# Patient Record
Sex: Female | Born: 1965 | Race: White | Hispanic: No | Marital: Single | Smoking: Current every day smoker
Health system: Southern US, Community
[De-identification: ages and names within clinical notes are randomized; demographics above are authoritative.]

## PROBLEM LIST (undated history)

## (undated) DIAGNOSIS — F17200 Nicotine dependence, unspecified, uncomplicated: Secondary | ICD-10-CM

## (undated) DIAGNOSIS — R1032 Left lower quadrant pain: Secondary | ICD-10-CM

## (undated) DIAGNOSIS — K5732 Diverticulitis of large intestine without perforation or abscess without bleeding: Secondary | ICD-10-CM

## (undated) HISTORY — DX: Diverticulitis of large intestine without perforation or abscess without bleeding: K57.32

## (undated) HISTORY — DX: Nicotine dependence, unspecified, uncomplicated: F17.200

## (undated) HISTORY — DX: Left lower quadrant pain: R10.32

---

## 2003-09-29 ENCOUNTER — Emergency Department (HOSPITAL_COMMUNITY): Admission: EM | Admit: 2003-09-29 | Discharge: 2003-09-29 | Payer: Self-pay | Admitting: Emergency Medicine

## 2008-06-28 ENCOUNTER — Ambulatory Visit: Payer: Self-pay | Admitting: Family Medicine

## 2008-09-06 ENCOUNTER — Ambulatory Visit: Payer: Self-pay | Admitting: Family Medicine

## 2010-09-04 ENCOUNTER — Encounter: Payer: Self-pay | Admitting: Internal Medicine

## 2010-09-04 ENCOUNTER — Ambulatory Visit (INDEPENDENT_AMBULATORY_CARE_PROVIDER_SITE_OTHER): Payer: 59 | Admitting: Internal Medicine

## 2010-09-04 ENCOUNTER — Other Ambulatory Visit: Payer: 59

## 2010-09-04 ENCOUNTER — Other Ambulatory Visit: Payer: Self-pay | Admitting: Internal Medicine

## 2010-09-04 DIAGNOSIS — F17200 Nicotine dependence, unspecified, uncomplicated: Secondary | ICD-10-CM | POA: Insufficient documentation

## 2010-09-04 DIAGNOSIS — Z0000 Encounter for general adult medical examination without abnormal findings: Secondary | ICD-10-CM

## 2010-09-04 LAB — CBC WITH DIFFERENTIAL/PLATELET
Basophils Relative: 0.6 % (ref 0.0–3.0)
Eosinophils Absolute: 0.1 10*3/uL (ref 0.0–0.7)
Eosinophils Relative: 1.6 % (ref 0.0–5.0)
HCT: 42.6 % (ref 36.0–46.0)
Lymphs Abs: 2.3 10*3/uL (ref 0.7–4.0)
MCHC: 34.3 g/dL (ref 30.0–36.0)
MCV: 91.2 fl (ref 78.0–100.0)
Monocytes Absolute: 0.4 10*3/uL (ref 0.1–1.0)
Neutrophils Relative %: 64.5 % (ref 43.0–77.0)
Platelets: 300 10*3/uL (ref 150.0–400.0)
WBC: 8.3 10*3/uL (ref 4.5–10.5)

## 2010-09-04 LAB — LIPID PANEL
Cholesterol: 102 mg/dL (ref 0–200)
LDL Cholesterol: 44 mg/dL (ref 0–99)
Triglycerides: 26 mg/dL (ref 0.0–149.0)

## 2010-09-04 LAB — HEPATIC FUNCTION PANEL
ALT: 15 U/L (ref 0–35)
Bilirubin, Direct: 0.2 mg/dL (ref 0.0–0.3)
Total Bilirubin: 0.7 mg/dL (ref 0.3–1.2)
Total Protein: 7.2 g/dL (ref 6.0–8.3)

## 2010-09-04 LAB — BASIC METABOLIC PANEL
BUN: 9 mg/dL (ref 6–23)
CO2: 30 mEq/L (ref 19–32)
Chloride: 102 mEq/L (ref 96–112)
Creatinine, Ser: 0.7 mg/dL (ref 0.4–1.2)
Potassium: 3.7 mEq/L (ref 3.5–5.1)

## 2010-09-04 LAB — URINALYSIS, ROUTINE W REFLEX MICROSCOPIC
Bilirubin Urine: NEGATIVE
Hgb urine dipstick: NEGATIVE
Leukocytes, UA: NEGATIVE
Nitrite: NEGATIVE
Urobilinogen, UA: 0.2 (ref 0.0–1.0)

## 2010-09-04 LAB — TSH: TSH: 1.18 u[IU]/mL (ref 0.35–5.50)

## 2010-09-10 NOTE — Assessment & Plan Note (Signed)
Summary: new pt cpx/[facility]/#/cd   Vital Signs:  Patient profile:   45 year old female Height:      69 inches (175.26 cm) Weight:      135.4 pounds (61.55 kg) BMI:     20.07 O2 Sat:      94 % on Room air Temp:     98.5 degrees F (36.94 degrees C) oral Pulse rate:   83 / minute BP sitting:   120 / 80  (left arm) Cuff size:   regular  Vitals Entered By: Orlan Leavens RMA (September 04, 2010 2:01 PM)  O2 Flow:  Room air CC: New patient CPX Is Patient Diabetic? No Pain Assessment Patient in pain? no        Primary Care Provider:  Newt Lukes MD  CC:  New patient CPX.  History of Present Illness: new pt to me and our practice, here to est care  patient is here today for annual physical. Patient feels well and has no complaints.   Preventive Screening-Counseling & Management  Alcohol-Tobacco     Alcohol drinks/day: 0     Alcohol Counseling: not indicated; patient does not drink     Smoking Status: current     Smoking Cessation Counseling: yes     Tobacco Counseling: to quit use of tobacco products  Caffeine-Diet-Exercise     Diet Counseling: not indicated; diet is assessed to be healthy     Does Patient Exercise: no     Exercise Counseling: to improve exercise regimen     Depression Counseling: not indicated; screening negative for depression  Safety-Violence-Falls     Seat Belt Counseling: not indicated; patient wears seat belts     Helmet Counseling: not applicable     Firearm Counseling: not applicable     Fall Risk Counseling: not indicated; no significant falls noted  Clinical Review Panels:  Immunizations   Last Tetanus Booster:  Historical (07/26/2004)   Current Medications (verified): 1)  None  Allergies (verified): 1)  ! 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 I have reviewed all other systems and they were negative.   Physical Exam  General:  thin, fit and spry - alert, well-developed, well-nourished, and cooperative to examination.    Head:  Normocephalic and atraumatic without obvious abnormalities. No apparent alopecia or balding. Eyes:  vision grossly intact; pupils equal, round and reactive to light.  conjunctiva and lids normal.   wears glasses Ears:  normal pinnae bilaterally, without erythema, swelling, or tenderness to palpation. TMs clear, without effusion, or cerumen impaction. Hearing grossly normal bilaterally  Mouth:  teeth and gums in good repair; mucous membranes moist, without lesions or ulcers. oropharynx clear without exudate, no erythema.  Neck:  supple, full ROM, no masses, no thyromegaly; no thyroid nodules or tenderness. no JVD or carotid bruits.  Lungs:  normal respiratory effort, no intercostal retractions or use of accessory muscles; normal breath sounds bilaterally - no crackles and no wheezes.    Heart:  normal rate, regular rhythm, no murmur, and no rub. BLE without edema. Abdomen:  soft, non-tender, normal bowel sounds, no distention; no masses and no appreciable hepatomegaly or splenomegaly.   Genitalia:  defer to gyn Msk:  No deformity or scoliosis noted of thoracic or lumbar spine.   Neurologic:  alert & oriented X3 and cranial nerves II-XII symetrically intact.  strength normal in all extremities, sensation intact to light  touch, and gait normal. speech fluent without dysarthria or aphasia; follows commands with good comprehension.  Skin:  no rashes, vesicles, ulcers, or erythema. No nodules or irregularity to palpation.  Psych:  Oriented X3, memory intact for recent and remote, normally interactive, good eye contact, not anxious appearing, not depressed appearing, and not agitated.      Impression & Recommendations:  Problem # 1:  PREVENTIVE HEALTH CARE (ICD-V70.0) Patient has been counseled on age-appropriate routine health concerns for screening and prevention. These are reviewed and up-to-date. Immunizations are up-to-date or declined. Labs ordered and ECG reviewed - poor r wave progression. refer mammo and gyn, esp with fh ovarian ca Orders: EKG w/ Interpretation (93000) TLB-Lipid Panel (80061-LIPID) TLB-BMP (Basic Metabolic Panel-BMET) (80048-METABOL) TLB-CBC Platelet - w/Differential (85025-CBCD) TLB-Hepatic/Liver Function Pnl (80076-HEPATIC) TLB-TSH (Thyroid Stimulating Hormone) (84443-TSH) TLB-Udip w/ Micro (81001-URINE) Gynecologic Referral (Gyn) Misc. Referral (Misc. Ref)  Problem # 2:  SMOKER (ICD-305.1) O2 sat 94% but no clinical abn or pulm symptoms -  5 minutes today spent on patient education regarding the unhealthy effects of continued tobacco abuse and encouragment of cessation including medical options available to help patient to quit smoking.   Patient Instructions: 1)  it was good to see you today. 2)  exam and vitals look good 3)  labs ordered today - your results will be called to you after review in 48-72 hours from the time of test completion and copy mailed to you; if any changes need to be made or there are abnormal results, you will be notified at that time 4)  we'll make referral to gyneclogy for PAP/pelvic examand screening mammography. Our office will contact you regarding these appointments once made.   5)  Tobacco is very bad for your health and your loved ones! You Should  stop smoking! 6)  Please schedule a follow-up appointment annually for medical physical and labs, call sooner if problems.    Orders Added: 1)  EKG w/ Interpretation [93000] 2)  New Patient 40-64 years [99386] 3)  TLB-Lipid Panel [80061-LIPID] 4)  TLB-BMP (Basic Metabolic Panel-BMET) [80048-METABOL] 5)  TLB-CBC Platelet - w/Differential [85025-CBCD] 6)  TLB-Hepatic/Liver Function Pnl [80076-HEPATIC] 7)  TLB-TSH (Thyroid Stimulating Hormone) [84443-TSH] 8)  TLB-Udip w/ Micro [81001-URINE] 9)  Gynecologic Referral [Gyn] 10)  Misc. Referral [Misc. Ref]   Immunization History:  Tetanus/Td Immunization History:    Tetanus/Td:  historical (07/26/2004)   Immunization History:  Tetanus/Td Immunization History:    Tetanus/Td:  Historical (07/26/2004)

## 2011-02-22 ENCOUNTER — Other Ambulatory Visit (INDEPENDENT_AMBULATORY_CARE_PROVIDER_SITE_OTHER): Payer: 59

## 2011-02-22 ENCOUNTER — Encounter: Payer: Self-pay | Admitting: Internal Medicine

## 2011-02-22 ENCOUNTER — Ambulatory Visit (INDEPENDENT_AMBULATORY_CARE_PROVIDER_SITE_OTHER): Payer: 59 | Admitting: Internal Medicine

## 2011-02-22 VITALS — BP 102/82 | HR 75 | Temp 98.2°F | Ht 69.0 in | Wt 132.0 lb

## 2011-02-22 DIAGNOSIS — R1032 Left lower quadrant pain: Secondary | ICD-10-CM

## 2011-02-22 DIAGNOSIS — F17200 Nicotine dependence, unspecified, uncomplicated: Secondary | ICD-10-CM

## 2011-02-22 LAB — BASIC METABOLIC PANEL
Calcium: 9 mg/dL (ref 8.4–10.5)
Chloride: 107 mEq/L (ref 96–112)
Creatinine, Ser: 0.7 mg/dL (ref 0.4–1.2)
Sodium: 140 mEq/L (ref 135–145)

## 2011-02-22 LAB — URINALYSIS
Bilirubin Urine: NEGATIVE
Hgb urine dipstick: NEGATIVE
Ketones, ur: NEGATIVE
Urine Glucose: NEGATIVE
Urobilinogen, UA: 0.2 (ref 0.0–1.0)

## 2011-02-22 LAB — HEPATIC FUNCTION PANEL
ALT: 14 U/L (ref 0–35)
Alkaline Phosphatase: 59 U/L (ref 39–117)
Bilirubin, Direct: 0.1 mg/dL (ref 0.0–0.3)
Total Protein: 7.3 g/dL (ref 6.0–8.3)

## 2011-02-22 LAB — CBC WITH DIFFERENTIAL/PLATELET
Basophils Relative: 0.5 % (ref 0.0–3.0)
Eosinophils Relative: 1.2 % (ref 0.0–5.0)
Hemoglobin: 13.9 g/dL (ref 12.0–15.0)
Lymphocytes Relative: 22.6 % (ref 12.0–46.0)
MCV: 92 fl (ref 78.0–100.0)
Neutro Abs: 4.4 10*3/uL (ref 1.4–7.7)
Neutrophils Relative %: 69.8 % (ref 43.0–77.0)
RBC: 4.55 Mil/uL (ref 3.87–5.11)
WBC: 6.4 10*3/uL (ref 4.5–10.5)

## 2011-02-22 NOTE — Patient Instructions (Addendum)
It was good to see you today. Test(s) ordered today. Your results will be called to you after review (48-72hours after test completion). If any changes need to be made, you will be notified at that time. we'll make referral for CT scan of abdomen and pelvis with contrast. Our office will contact you regarding appointment(s) once made. You will then be contacted with results as available (24-72 hours after test complete) follow up with your gynecologist as discussed for pelvic/PAP - call if referral needed for this Call in next 2 weeks if symptoms unimpooved for other evaluation or treatment as needed Don't forget to think more about giving up those cigarettes!

## 2011-02-22 NOTE — Progress Notes (Signed)
  Subjective:     Heidi Gordon is a 45 y.o. female who presents for evaluation of abdominal pain. Onset was 10 days ago. Symptoms have been unchanged. The pain is described as burning, stabbing and twisting, and is 5/10 in intensity. Pain is located in the LLQ without radiation.  Aggravating factors: movement, pressure and morning.  Alleviating factors: bowel movements. Associated symptoms: none. The patient denies anorexia, chills, constipation, diarrhea, dysuria, fever, hematochezia, hematuria, melena, nausea and vomiting.  The patient's history has been marked as reviewed and updated as appropriate.  Review of Systems Pertinent items are noted in HPI.   No travel, no weight changes. No chest pain or shortness of breath; no back pain   Objective:    BP 102/82  Pulse 75  Temp(Src) 98.2 F (36.8 C) (Oral)  Ht 5\' 9"  (1.753 m)  Wt 132 lb (59.875 kg)  BMI 19.49 kg/m2  SpO2 99% General appearance: alert, cooperative and no distress Lungs: clear to auscultation bilaterally Heart: regular rate and rhythm, S1, S2 normal, no murmur, click, rub or gallop Abdomen: soft, non-tender; bowel sounds normal; no masses,  no organomegaly    Assessment:    Abdominal pain, likely secondary to colonic spasm; concern due to FH anal cancer - dad dx age 68yo .   Tobacco abuse, ongoing Plan:    The diagnosis was discussed with the patient and evaluation and treatment plans outlined. See orders for lab and imaging studies. Reassured patient that symptoms are almost certainly benign and self-resolving. Referral to Gynecology. Call back with update in 2 weeks. sooner if problems -  considered treatment with antispasmodic, pt declines need for same at this time  5 minutes today spent counseling patient on unhealthy effects of continued tobacco abuse and encouragement of cessation including medical options available to help the patient quit smoking.

## 2011-02-24 ENCOUNTER — Ambulatory Visit (INDEPENDENT_AMBULATORY_CARE_PROVIDER_SITE_OTHER)
Admission: RE | Admit: 2011-02-24 | Discharge: 2011-02-24 | Disposition: A | Discharge status: Home / Self Care | Payer: 59 | Source: Ambulatory Visit | Attending: Internal Medicine | Admitting: Internal Medicine

## 2011-02-24 DIAGNOSIS — R1032 Left lower quadrant pain: Secondary | ICD-10-CM

## 2011-02-24 MED ORDER — IOHEXOL 300 MG/ML  SOLN
100.0000 mL | Freq: Once | INTRAMUSCULAR | Status: AC | PRN
  Administered 2011-02-24: 100 mL via INTRAVENOUS

## 2011-05-03 ENCOUNTER — Telehealth: Payer: Self-pay | Admitting: Internal Medicine

## 2011-05-03 ENCOUNTER — Other Ambulatory Visit (INDEPENDENT_AMBULATORY_CARE_PROVIDER_SITE_OTHER): Payer: 59

## 2011-05-03 ENCOUNTER — Ambulatory Visit (INDEPENDENT_AMBULATORY_CARE_PROVIDER_SITE_OTHER): Payer: 59 | Admitting: Internal Medicine

## 2011-05-03 ENCOUNTER — Encounter: Payer: Self-pay | Admitting: Internal Medicine

## 2011-05-03 VITALS — BP 122/64 | HR 89 | Temp 97.8°F | Ht 69.0 in | Wt 124.0 lb

## 2011-05-03 DIAGNOSIS — R1032 Left lower quadrant pain: Secondary | ICD-10-CM

## 2011-05-03 LAB — URINALYSIS
Nitrite: NEGATIVE
Total Protein, Urine: NEGATIVE
Urine Glucose: NEGATIVE
pH: 6 (ref 5.0–8.0)

## 2011-05-03 LAB — LIPASE: Lipase: 39 U/L (ref 11.0–59.0)

## 2011-05-03 LAB — CBC WITH DIFFERENTIAL/PLATELET
Basophils Relative: 0.2 % (ref 0.0–3.0)
Eosinophils Absolute: 0.1 10*3/uL (ref 0.0–0.7)
Eosinophils Relative: 0.4 % (ref 0.0–5.0)
Lymphocytes Relative: 11.8 % — ABNORMAL LOW (ref 12.0–46.0)
MCHC: 33.9 g/dL (ref 30.0–36.0)
Neutrophils Relative %: 80.3 % — ABNORMAL HIGH (ref 43.0–77.0)
RBC: 4.55 Mil/uL (ref 3.87–5.11)
WBC: 14 10*3/uL — ABNORMAL HIGH (ref 4.5–10.5)

## 2011-05-03 LAB — BASIC METABOLIC PANEL
BUN: 11 mg/dL (ref 6–23)
Chloride: 103 mEq/L (ref 96–112)
Potassium: 3.8 mEq/L (ref 3.5–5.1)

## 2011-05-03 LAB — HEPATIC FUNCTION PANEL
ALT: 19 U/L (ref 0–35)
AST: 15 U/L (ref 0–37)
Bilirubin, Direct: 0.1 mg/dL (ref 0.0–0.3)
Total Bilirubin: 0.8 mg/dL (ref 0.3–1.2)

## 2011-05-03 LAB — SEDIMENTATION RATE: Sed Rate: 13 mm/hr (ref 0–22)

## 2011-05-03 MED ORDER — IBUPROFEN 600 MG PO TABS: ORAL_TABLET | ORAL | Status: DC

## 2011-05-03 MED ORDER — HYDROCODONE-ACETAMINOPHEN 7.5-325 MG PO TABS: 1.0000 | ORAL_TABLET | Freq: Four times a day (QID) | ORAL | Status: DC | PRN

## 2011-05-03 NOTE — Assessment & Plan Note (Signed)
10/12 - seems to be related to a ruptured ovarian cyst - see below: IMPRESSION - abd CT 8/12: Dominant 2.5 cm follicle in the left ovary.  Otherwise, no CT findings to account for the patient's abdominal  complaints.  Original Report Authenticated By: Charline Bills, M.D.  We will get labs. Toradol IM Start Hydrocodone, Ibuprofen. To ER if sick

## 2011-05-03 NOTE — Telephone Encounter (Signed)
Heidi Gordon, please, inform patient that all labs are normal except for elev WBC. Rx as we discussed. Call if not better or if worse Thx

## 2011-05-03 NOTE — Patient Instructions (Signed)
Go to ER if worse 

## 2011-05-03 NOTE — Progress Notes (Signed)
  Subjective:    Patient ID: Heidi Gordon, female    DOB: 16-Jul-1966, 45 y.o.   MRN: [id number]  HPI  C/o severe LLQ abd pain since Sun am irrad to RLQ and L chest. She had some nausea. No LOC, chills, diarrhea, dysuria. She did not take any pain meds. No injury. The abd pain she had in 8/12 has resolved. LMP 2wks ago. Denies pregnancy  Review of Systems  Constitutional: Negative for chills, activity change, appetite change, fatigue and unexpected weight change.  HENT: Negative for congestion, mouth sores and sinus pressure.   Eyes: Negative for visual disturbance.  Respiratory: Negative for cough, chest tightness and shortness of breath.   Cardiovascular: Negative for chest pain.  Gastrointestinal: Positive for nausea and abdominal pain. Negative for vomiting, constipation, blood in stool, abdominal distention, anal bleeding and rectal pain.  Genitourinary: Positive for flank pain. Negative for dysuria, urgency, frequency, decreased urine volume, difficulty urinating and vaginal pain.  Musculoskeletal: Negative for back pain and gait problem.  Skin: Negative for pallor and rash.  Neurological: Negative for dizziness, tremors, weakness, numbness and headaches.  Psychiatric/Behavioral: Negative for confusion and sleep disturbance.   LMP    Objective:   Physical Exam  Constitutional: She appears well-developed and well-nourished. No distress.  HENT:  Head: Normocephalic.  Right Ear: External ear normal.  Left Ear: External ear normal.  Nose: Nose normal.  Mouth/Throat: Oropharynx is clear and moist.  Eyes: Conjunctivae are normal. Pupils are equal, round, and reactive to light. Right eye exhibits no discharge. Left eye exhibits no discharge.  Neck: Normal range of motion. Neck supple. No JVD present. No tracheal deviation present. No thyromegaly present.  Cardiovascular: Normal rate, regular rhythm and normal heart sounds.   Pulmonary/Chest: No stridor. No respiratory distress. She  has no wheezes.  Abdominal: Soft. Bowel sounds are normal. She exhibits no distension and no mass. There is tenderness. There is guarding. There is no rebound.  Musculoskeletal: She exhibits no edema and no tenderness.  Lymphadenopathy:    She has no cervical adenopathy.  Neurological: She displays normal reflexes. No cranial nerve deficit. She exhibits normal muscle tone. Coordination normal.  Skin: No rash noted. No erythema.  Psychiatric: She has a normal mood and affect. Her behavior is normal. Judgment and thought content normal.          Assessment & Plan:

## 2011-05-04 MED ORDER — TRAMADOL HCL 50 MG PO TABS: 50.0000 mg | ORAL_TABLET | Freq: Two times a day (BID) | ORAL | Status: DC | PRN

## 2011-05-04 MED ORDER — PROMETHAZINE HCL 12.5 MG PO TABS: 12.5000 mg | ORAL_TABLET | Freq: Four times a day (QID) | ORAL | Status: DC | PRN

## 2011-05-04 MED ORDER — CIPROFLOXACIN HCL 500 MG PO TABS: 500.0000 mg | ORAL_TABLET | Freq: Two times a day (BID) | ORAL | Status: DC

## 2011-05-04 NOTE — Telephone Encounter (Signed)
Cipro called in; start Cipro. To ER if worse D/c Vicodin. Tramadol called in. Promethazine called in OV tomorrow w/Dr Felicity Coyer or me Thx

## 2011-05-04 NOTE — Telephone Encounter (Signed)
Patient informed, Scheduled with Dr Felicity Coyer tomorrow AM.

## 2011-05-04 NOTE — Telephone Encounter (Signed)
Pt called stating she has having chills and pain and believes she is running a fever. Pt also states that Hydrocodone is causing stomach upset to be worse. Pt is requesting ABX, please advise.

## 2011-05-04 NOTE — Telephone Encounter (Signed)
Pt informed. What abx does she need? She never got it.

## 2011-05-05 ENCOUNTER — Encounter: Payer: Self-pay | Admitting: Internal Medicine

## 2011-05-05 ENCOUNTER — Ambulatory Visit (INDEPENDENT_AMBULATORY_CARE_PROVIDER_SITE_OTHER): Payer: 59 | Admitting: Internal Medicine

## 2011-05-05 ENCOUNTER — Ambulatory Visit (INDEPENDENT_AMBULATORY_CARE_PROVIDER_SITE_OTHER)
Admission: RE | Admit: 2011-05-05 | Discharge: 2011-05-05 | Disposition: A | Discharge status: Home / Self Care | Payer: 59 | Source: Ambulatory Visit | Attending: Internal Medicine | Admitting: Internal Medicine

## 2011-05-05 ENCOUNTER — Other Ambulatory Visit: Payer: Self-pay | Admitting: Internal Medicine

## 2011-05-05 DIAGNOSIS — D72829 Elevated white blood cell count, unspecified: Secondary | ICD-10-CM

## 2011-05-05 DIAGNOSIS — K5732 Diverticulitis of large intestine without perforation or abscess without bleeding: Secondary | ICD-10-CM

## 2011-05-05 DIAGNOSIS — N83202 Unspecified ovarian cyst, left side: Secondary | ICD-10-CM

## 2011-05-05 DIAGNOSIS — R1032 Left lower quadrant pain: Secondary | ICD-10-CM

## 2011-05-05 DIAGNOSIS — N83209 Unspecified ovarian cyst, unspecified side: Secondary | ICD-10-CM

## 2011-05-05 MED ORDER — KETOROLAC TROMETHAMINE 30 MG/ML IJ SOLN
30.0000 mg | Freq: Once | INTRAMUSCULAR | Status: AC
  Administered 2011-05-05: 30 mg via INTRAMUSCULAR

## 2011-05-05 MED ORDER — METRONIDAZOLE 500 MG PO TABS: 500.0000 mg | ORAL_TABLET | Freq: Three times a day (TID) | ORAL | Status: AC

## 2011-05-05 MED ORDER — IOHEXOL 300 MG/ML  SOLN
80.0000 mL | Freq: Once | INTRAMUSCULAR | Status: AC | PRN
  Administered 2011-05-05: 80 mL via INTRAVENOUS

## 2011-05-05 NOTE — Patient Instructions (Signed)
It was good to see you today. Tordol shot for pain given to you today Add flagyl antibiotics to Cipro antibiotics and other current medications for symptomatic relief as discussed Repeat CT scan to follow up ovary cyst and exclude other GI/colon problems as discussed - refer to GI or gyn to depend on these findings and your response to treatment - will call you after results reviewed with "next step" if your symptoms continue to worsen (pain, fever, etc), or if you are unable take anything by mouth (pills, fluids, etc), you should go to the emergency room or call again here for further evaluation and treatment.

## 2011-05-05 NOTE — Progress Notes (Signed)
  Subjective:    Patient ID: Heidi Gordon, female    DOB: 11/16/1965, 45 y.o.   MRN: [id number]  HPI Here follow up LLQ pain Onset 4 days ago Seen 48h ago for same by partner AVP>> labs with inc WBC, neg UA Pain not improved with hydrocodone (nausea worse)>>started Cipro, NSAIDs, tramadol and prometh last PM Also hx same but less severe pain episode late 01/2011 LLQ pain>> CT a/p w/CM showed dominate  L ov follicle, no other abnormality to explain pain Pain associated with nausea, no V or bowel changes Started LLQ, now involving BLQ +fever +FH anal ca (dad age 71) and aunt intestinal ca (age 78s) but no inflammatory bowel dz hx  Past Medical History  Diagnosis Date  . SMOKER     Review of Systems  Constitutional: Positive for fever (101 at home last 48h), chills and appetite change. Negative for unexpected weight change.  Respiratory: Negative for cough and shortness of breath.   Cardiovascular: Negative for chest pain.  Gastrointestinal: Positive for nausea and abdominal pain. Negative for vomiting, diarrhea, constipation and rectal pain.  Genitourinary: Positive for pelvic pain. Negative for dysuria, hematuria, flank pain, vaginal bleeding, vaginal discharge, vaginal pain and menstrual problem.       Objective:   Physical Exam BP 122/72  Pulse 106  Temp(Src) 98.9 F (37.2 C) (Oral)  Ht 5\' 9"  (1.753 m)  Wt 132 lb 3.2 oz (59.966 kg)  BMI 19.52 kg/m2  SpO2 98% Wt Readings from Last 3 Encounters:  05/05/11 132 lb 3.2 oz (59.966 kg)  05/03/11 124 lb 0.6 oz (56.264 kg)  02/22/11 132 lb (59.875 kg)   Constitutional: She is thin but appears well-developed and well-nourished. No distress but uncomfortable due to pain.  Neck: Normal range of motion. Neck supple. No JVD present. No thyromegaly present.  Cardiovascular: Normal rate, regular rhythm and normal heart sounds.  No murmur heard. No BLE edema. Pulmonary/Chest: Effort normal and breath sounds normal. No respiratory  distress. She has no wheezes.  Abdominal: Soft. Bowel sounds are hyperactive. She exhibits no distension. There is tenderness along L side, no R/G. no masses Pelvic: defer Skin: Skin is warm and dry. No rash noted. No erythema.  Psychiatric: She has a normal mood and affect. Her behavior is normal. Judgment and thought content normal.   LMP 20 days ago, normal  Lab Results  Component Value Date   WBC 14.0* 05/03/2011   HGB 14.3 05/03/2011   HCT 42.1 05/03/2011   PLT 295.0 05/03/2011   GLUCOSE 87 05/03/2011   CHOL 102 09/04/2010   TRIG 26.0 09/04/2010   HDL 52.70 09/04/2010   LDLCALC 44 09/04/2010   ALT 19 05/03/2011   AST 15 05/03/2011   NA 139 05/03/2011   K 3.8 05/03/2011   CL 103 05/03/2011   CREATININE 0.8 05/03/2011   BUN 11 05/03/2011   CO2 26 05/03/2011   TSH 1.18 09/04/2010        Assessment & Plan:  LLQ pain with leukocytosis - reports fever at home to 101 - suspect diverticular dz vs ovarian problem (prior CT with last episode showed only L ov cyst 2.5cm) - re image now with follow up CT to reeval ?GI abnormalities vs change in cyst to help determine if GI or gyn eval needed (never follow up with gyn since 02/24/11 CT) - continue Cipro (started <12h ago), add Flagyl and cont symptomatic tx with prn phenergan and ibuprofen - shot Tordol 30mg  IM today

## 2011-05-21 ENCOUNTER — Encounter: Payer: Self-pay | Admitting: Internal Medicine

## 2011-05-27 ENCOUNTER — Ambulatory Visit: Payer: 59 | Admitting: Internal Medicine

## 2011-06-24 ENCOUNTER — Encounter: Payer: Self-pay | Admitting: Internal Medicine

## 2011-06-24 ENCOUNTER — Ambulatory Visit (INDEPENDENT_AMBULATORY_CARE_PROVIDER_SITE_OTHER): Payer: 59 | Admitting: Internal Medicine

## 2011-06-24 DIAGNOSIS — K5732 Diverticulitis of large intestine without perforation or abscess without bleeding: Secondary | ICD-10-CM

## 2011-06-24 DIAGNOSIS — Z720 Tobacco use: Secondary | ICD-10-CM

## 2011-06-24 DIAGNOSIS — R109 Unspecified abdominal pain: Secondary | ICD-10-CM

## 2011-06-24 DIAGNOSIS — F17200 Nicotine dependence, unspecified, uncomplicated: Secondary | ICD-10-CM

## 2011-06-24 MED ORDER — PEG-KCL-NACL-NASULF-NA ASC-C 100 G PO SOLR: 1.0000 | Freq: Once | ORAL | Status: DC

## 2011-06-24 NOTE — Patient Instructions (Signed)
You have been scheduled for a colonoscopy. Please follow written instructions given to you at your visit today.  Please pick up your prep kit at the pharmacy within the next 2-3 days.  We have sent the following medications to your pharmacy for you to pick up at your convenience: moviprep, please follow the instructions given to you today at your visit.

## 2011-06-24 NOTE — Progress Notes (Signed)
Addended by: Adonis Housekeeper A on: 06/24/2011 10:40 AM   Modules accepted: Orders

## 2011-06-24 NOTE — Progress Notes (Signed)
Subjective:    Patient ID: Heidi Gordon, female    DOB: 08-23-1965, 45 y.o.   MRN: [id number]  HPI Heidi Gordon is a 45 yo female with PMH of tobacco use and diverticulosis with an episode of diverticulitis seen in consultation at the request of Dr. Felicity Coyer for evaluation of left lower quadrant pain. The patient reports her lower abdominal pain dates back to the summer of 2012 (around June or August).  Initially the patient was unsure if this pain was resulting from an ovarian cyst or another source such as her colon. The pain seemed improved over time, and then she developed more severe pain in October 2012. This pain was described as a sharp left lower quadrant pain which was severe in nature. She reports this was associated with nausea and vomiting, fever and poor appetite. CT scan at that point showed inflammation and possible microperforation and she was treated with approximately 3 weeks of ciprofloxacin and metronidazole. She reports this significantly helped her pain, and today she is doing "okay". She reports she's had "no major flares since October". She does describe a "dense feeling" or heaviness in her left lower corner. She is having bowel movements once daily to every other day, but they remain loose. They are nonbloody and she denies melena. Prior to the last several months her stools were once daily and formed. She is no longer having nausea or vomiting. Her appetite is okay and her weight has been stable to slightly increased. She's not had further fevers. She is still menstruating and these cycles have been normal for her.    Review of Systems Constitutional: Negative for fever, chills, night sweats, activity change, appetite change and unexpected weight change HEENT: Negative for sore throat, mouth sores and trouble swallowing. Eyes: Negative for visual disturbance Respiratory: Negative for cough, chest tightness and shortness of breath Cardiovascular: Negative for chest pain,  palpitations and lower extremity swelling Gastrointestinal: See history of present illness Genitourinary: Negative for dysuria and hematuria. Musculoskeletal: Negative for back pain, arthralgias and myalgias Skin: Negative for rash or color change Neurological: Negative for headaches, weakness, numbness Hematological: Negative for adenopathy, negative for easy bruising/bleeding Psychiatric/behavioral: Negative for depressed mood, negative for anxiety   Patient Active Problem List  Diagnoses  . SMOKER  . LLQ abdominal pain   Past Surgical History  Procedure Date  . Cesarean section 2006   Meds: NONE  Allergies  Allergen Reactions  . Penicillins    Family History  Problem Relation Age of Onset  . Cancer Sister     Ovarian w/ hysterectomy age 58  . Cancer Father     Anal  . Alcohol abuse Other   . Hypertension Other   . Diabetes Father   . Colon polyps Father   . Colon cancer Father     Social History  . Marital Status: Single   Social History Main Topics  . Smoking status: Current Everyday Smoker  . Smokeless tobacco: None  . Alcohol Use: No  . 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 No distress. HEENT: Normocephalic and atraumatic. Oropharynx is clear and moist. No oropharyngeal exudate. Conjunctivae are normal. Pupils are  equal round and reactive to light. No scleral icterus. Neck: Neck supple. Trachea midline. Cardiovascular: Normal rate, regular rhythm and intact distal pulses. No M/R/G Pulmonary/chest: Effort normal and breath sounds normal. No wheezing, rales or rhonchi. Abdominal: Soft, tenderness to palpation left middle and lower quadrant without rebound or guarding,  nondistended. Bowel sounds active throughout. There are no masses palpable. No hepatosplenomegaly. Extremities: no clubbing, cyanosis, or edema Lymphadenopathy: No cervical adenopathy noted. Neurological: Alert and oriented to person place and time. Skin: Skin is warm and dry. No rashes noted. Tattoos noted upper extremities Psychiatric: Normal mood and affect. Behavior is normal.  CT abdomen and pelvis 05/05/2011  Comparison: 02/24/2011   Findings: 6 mm tiny low density lesion in the left liver is unchanged.  This is probably a cyst.  Otherwise liver and spleen are normal.  Stomach, duodenum, pancreas, gallbladder, and adrenal glands are normal.  Stable tiny cyst in the upper pole of the left kidney.  The right kidney is unremarkable.   No abdominal aortic aneurysm.  No free fluid or lymphadenopathy in the abdomen.   Just lateral to the left kidney, there is a short segment of abnormal appearing proximal to mid descending colon.  The colon has edema/inflammation in a small fluid collection just posterior to it which is probably extraperitoneal.  There are several tiny associated extraluminal gas locules and some subtle rim enhancement of the fluid.   Imaging through the pelvis shows diverticular changes in the sigmoid colon without sigmoid diverticulitis.  Uterus is unremarkable.  The bladder is decompressed.  There is no adnexal mass.  No pelvic sidewall lymphadenopathy.  The terminal ileum and the appendix are normal.   Bone windows reveal no worrisome lytic or sclerotic osseous lesions.  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   IMPRESSION: Abnormal proximal to mid descending colon. Imaging features are most suggestive of diverticulitis with a microperforation and a tiny amount of adjacent edema/fluid. There is no drainable fluid collection or abscess at this time.  Colonic neoplasm could present similarly, but this area of the  colon was normal on the CT scan from 2 months ago. ______________________________________________________________________ CT abdomen and pelvis 02/22/2011:   Findings: Lung bases are clear.   6 mm hypoenhancing lesion in the medial segment left hepatic lobe (series 2/image 20), too small to characterize, likely benign.   Spleen, pancreas, and adrenal glands are within normal limits.   Gallbladder is unremarkable.  No intrahepatic or extrahepatic ductal dilatation.   Kidneys are unremarkable, noting a 9 mm left upper pole cyst.  No hydronephrosis.   No evidence of bowel obstruction.  Normal appendix.   No abdominopelvic ascites.   No suspicious abdominopelvic lymphadenopathy.   No evidence of abdominal aortic aneurysm.   Uterus and right ovary are unremarkable.  Left ovary is notable for a dominant 2.5 cm follicle.   Bladder is within normal limits.   Mild degenerative changes of the visualized thoracolumbar spine. Bilateral pars defects at L5 with grade 1 anterolisthesis of L5 on S1.   IMPRESSION: Dominant 2.5 cm follicle in the left ovary.   Otherwise, no CT findings to account for the patient's abdominal complaints.     Assessment & Plan:  45 yo female with PMH of tobacco use and diverticulosis with an episode of diverticulitis seen in consultation at the request of Dr. Felicity Coyer for evaluation of left lower quadrant pain  1. Diverticulitis -- the patient had CT evidence of diverticulitis  in the descending colon along with likely microperforation and small abscess.  Clinically she is significant a better, though she does still have some heaviness in the left lower quadrant. It is unclear to me if this represents incompletely resolved infection or possible mild stricture from her resolved inflammation. While diverticulitis remains highest in the differential, I cannot completely exclude another process such as colonic Crohn's or underlying malignancy. Given this and her  persistent symptoms I recommended proceeding with colonoscopy and she is agreeable. I like to check a CBC today to ensure that this is normalized. It remains elevated then I will be inclined to treat her empirically for incompletely resolved diverticulitis. We discussed colonoscopy including the risk and benefits. Followup will be after colonoscopy. I offered pain medication and the patient does not feel this is necessary at this time. I have asked that she call me back should this change.

## 2011-07-15 ENCOUNTER — Encounter: Payer: 59 | Admitting: Internal Medicine

## 2011-08-05 ENCOUNTER — Encounter: Payer: 59 | Admitting: Internal Medicine

## 2013-01-19 IMAGING — CT CT ABD-PELV W/ CM
2 of 5 series · 16 of 46 positions shown, 18 images · IV contrast (Omnipaque 300)
Comparison: 02/24/2011

CLINICAL DATA: Left lower quadrant and left pelvic pain.

CT ABDOMEN AND PELVIS WITH CONTRAST
TECHNIQUE: Multidetector CT imaging of the abdomen and pelvis was
performed following the standard protocol during bolus
administration of intravenous contrast.
Contrast: 80mL OMNIPAQUE IOHEXOL 300 MG/ML IV SOLN

[Series 2: abd/ pel 5mm · axial · 0.63mm/px · z∈[-426,-46]mm · 13 of 86 slices shown, 15 images]
[im 5/86  soft-tissue]
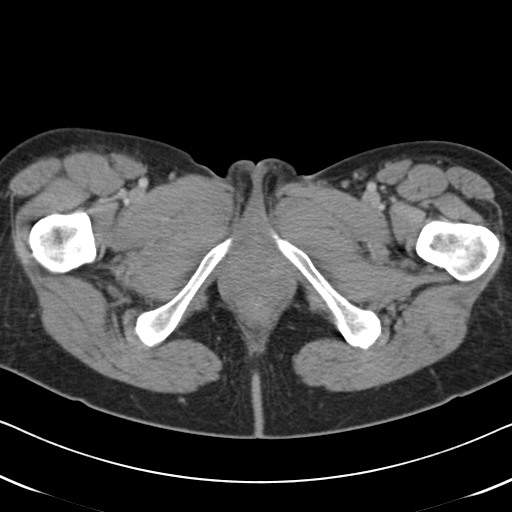
[im 5/86  bone]
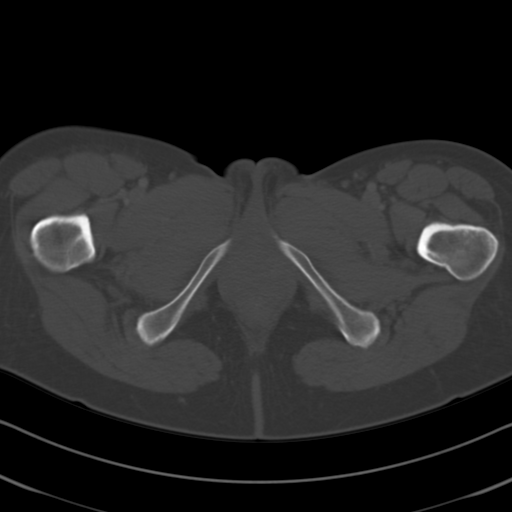
[im 14/86  soft-tissue]
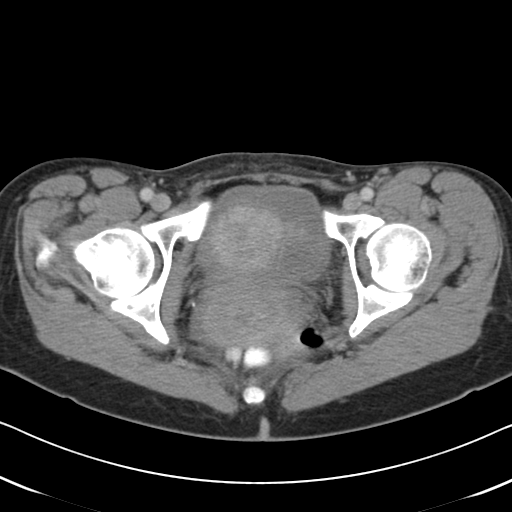
[im 18/86  soft-tissue]
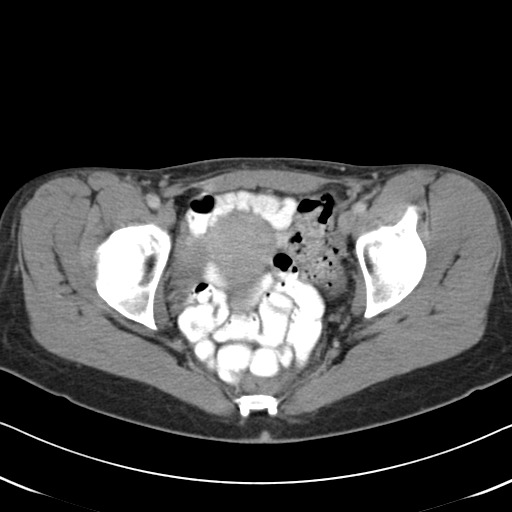
[im 23/86  soft-tissue]
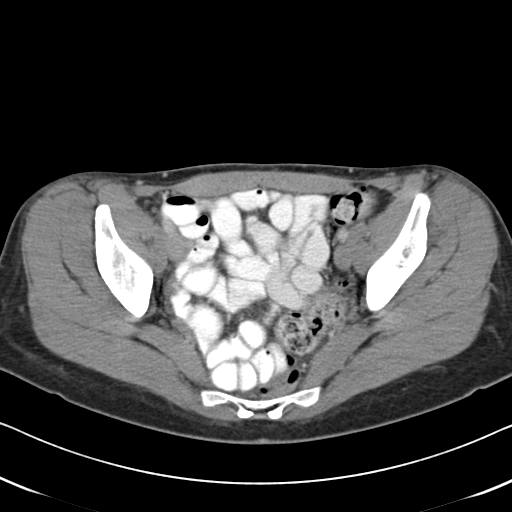
[im 32/86  soft-tissue]
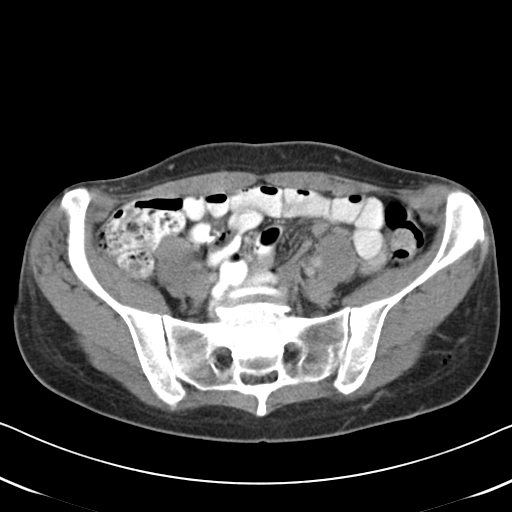
[im 36/86  soft-tissue]
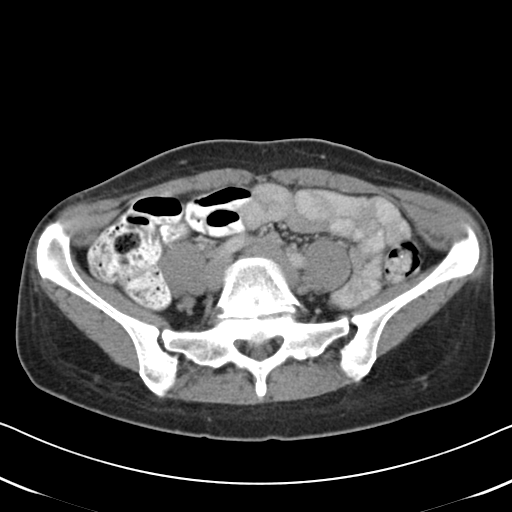
[im 45/86  soft-tissue]
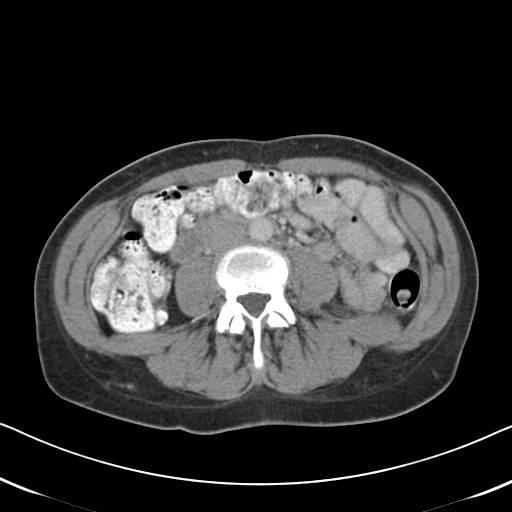
[im 50/86  soft-tissue]
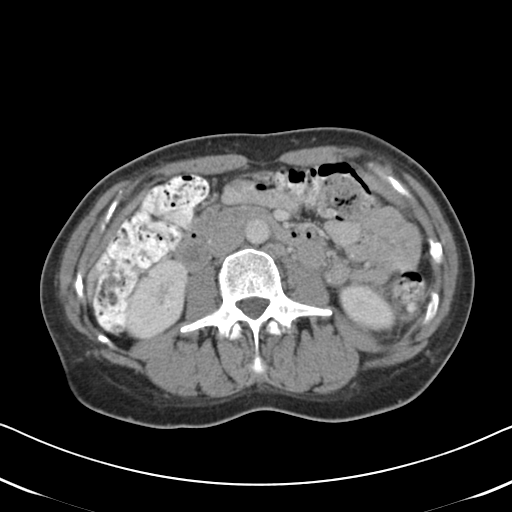
[im 54/86  soft-tissue]
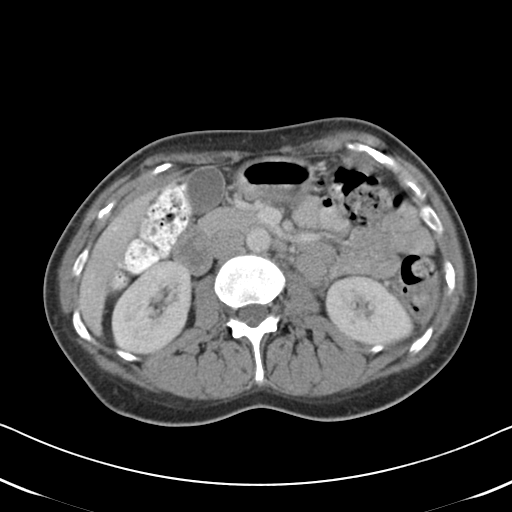
[im 54/86  bone]
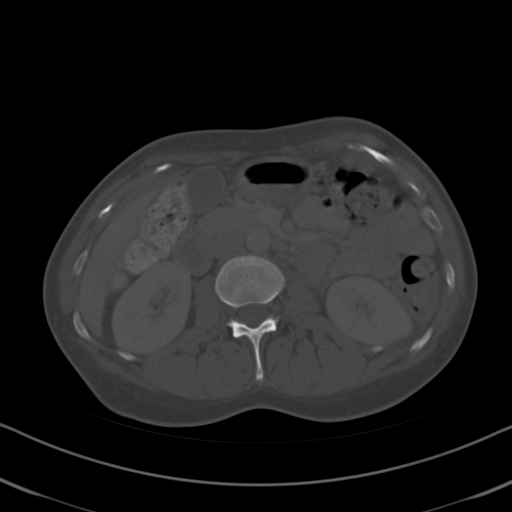
[im 63/86  soft-tissue]
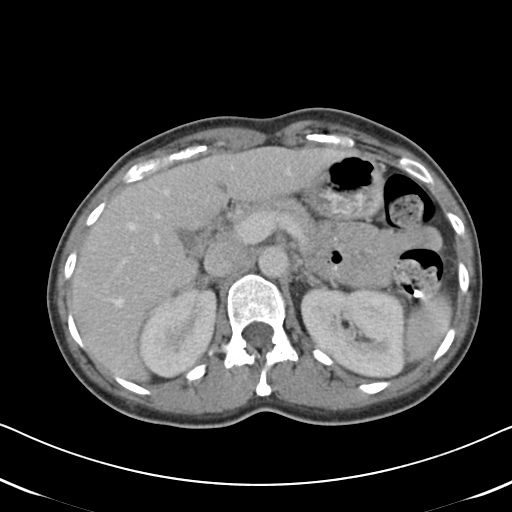
[im 68/86  soft-tissue]
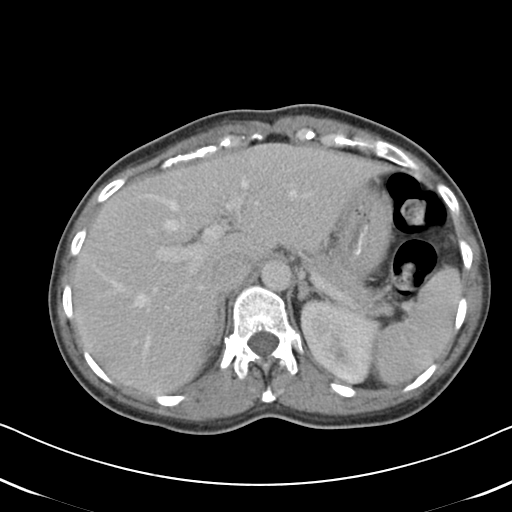
[im 72/86  soft-tissue]
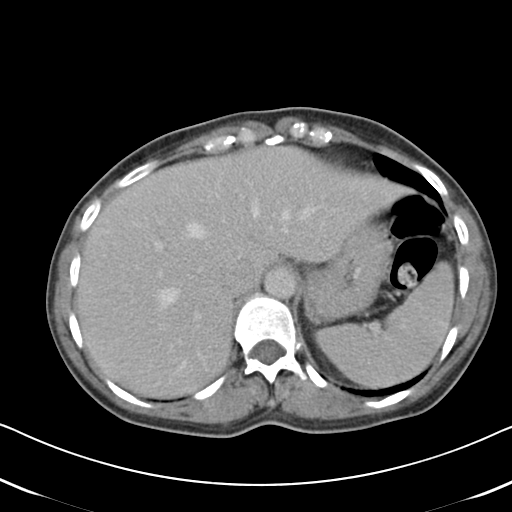
[im 81/86  soft-tissue]
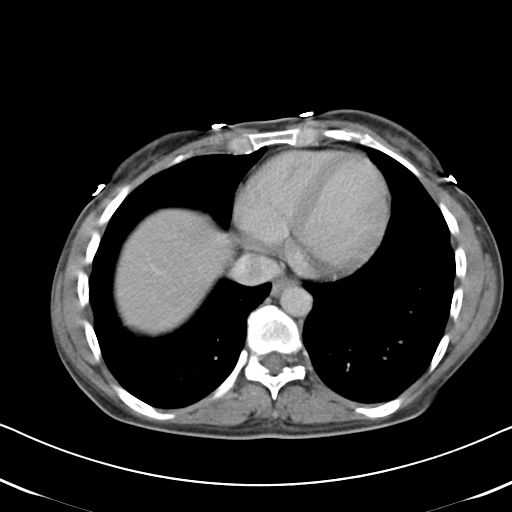

[Series 602: cor · coronal · 0.87mm/px · 3 of 87 slices shown]
[im 29/87  soft-tissue]
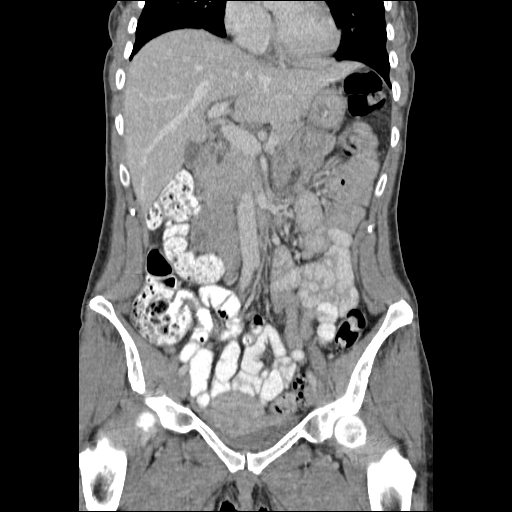
[im 39/87  soft-tissue]
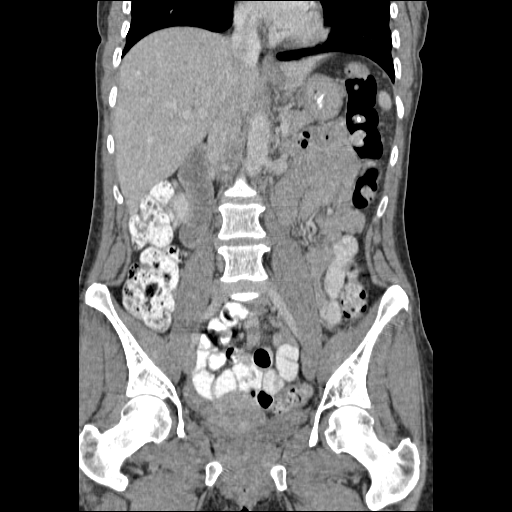
[im 48/87  soft-tissue]
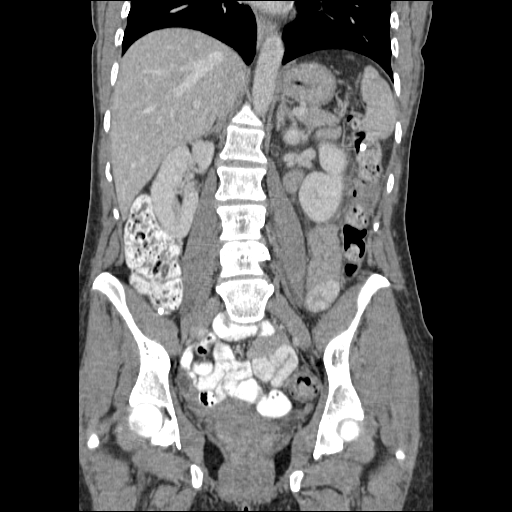

[16 of 46 positions shown; findings below may reference images not displayed]

FINDINGS: 6 mm tiny low density lesion in the left liver is
unchanged.  This is probably a cyst.  Otherwise liver and spleen
are normal.  Stomach, duodenum, pancreas, gallbladder, and adrenal
glands are normal.  Stable tiny cyst in the upper pole of the left
kidney.  The right kidney is unremarkable.

No abdominal aortic aneurysm.  No free fluid or lymphadenopathy in
the abdomen.

Just lateral to the left kidney, there is a short segment of
abnormal appearing proximal to mid descending colon.  The colon has
edema/inflammation in a small fluid collection just posterior to it
which is probably extraperitoneal.  There are several tiny
associated extraluminal gas locules and some subtle rim enhancement
of the fluid.

Imaging through the pelvis shows diverticular changes in the
sigmoid colon without sigmoid diverticulitis.  Uterus is
unremarkable.  The bladder is decompressed.  There is no adnexal
mass.  No pelvic sidewall lymphadenopathy.  The terminal ileum and
the appendix are normal.

Bone windows reveal no worrisome lytic or sclerotic osseous
lesions.  Bilateral pars defects are seen at L5 with grade 1-2
anterolisthesis of L5 on S1.
IMPRESSION: Abnormal proximal to mid descending colon. Imaging features are
most suggestive of diverticulitis with a microperforation and a
tiny amount of adjacent edema/fluid. There is no drainable fluid
collection or abscess at this time.  Colonic neoplasm could present
similarly, but this area of the colon was normal on the CT scan
from 2 months ago.

I personally called these results to Dr. Daoiz by telephone at
[phone] hours on 05/05/2011.

## 2013-07-13 ENCOUNTER — Ambulatory Visit (INDEPENDENT_AMBULATORY_CARE_PROVIDER_SITE_OTHER): Payer: BC Managed Care – PPO | Admitting: Internal Medicine

## 2013-07-13 ENCOUNTER — Encounter: Payer: Self-pay | Admitting: Internal Medicine

## 2013-07-13 VITALS — BP 120/80 | HR 71 | Temp 98.7°F | Wt 142.1 lb

## 2013-07-13 DIAGNOSIS — L259 Unspecified contact dermatitis, unspecified cause: Secondary | ICD-10-CM

## 2013-07-13 DIAGNOSIS — R1900 Intra-abdominal and pelvic swelling, mass and lump, unspecified site: Secondary | ICD-10-CM

## 2013-07-13 DIAGNOSIS — Z1239 Encounter for other screening for malignant neoplasm of breast: Secondary | ICD-10-CM

## 2013-07-13 DIAGNOSIS — L309 Dermatitis, unspecified: Secondary | ICD-10-CM

## 2013-07-13 MED ORDER — TRIAMCINOLONE ACETONIDE 0.1 % EX CREA: 1.0000 "application " | TOPICAL_CREAM | Freq: Two times a day (BID) | CUTANEOUS | Status: DC

## 2013-07-13 NOTE — Patient Instructions (Signed)
It was good to see you today.  We have reviewed your prior records including labs and tests today  Medications reviewed and updated Apply steroid cream to rash twice daily as needed -Your prescription(s) have been submitted to your pharmacy. Please take as directed and contact our office if you believe you are having problem(s) with the medication(s).  Will refer to radiology for screening mammogram and pelvic ultrasound -  Suspect the "mass"is an enlarged lymph node. Ultrasound will further define the anatomy and if problems, will pursue additional treatment as needed  Please send copy of your annual wellness lab results including lipids, sugar and thyroid  Follow up every 1-2 years here for medical annual wellness review, please call sooner if problems

## 2013-07-13 NOTE — Progress Notes (Signed)
   Subjective:    Patient ID: Heidi Gordon, female    DOB: 1965/12/29, 47 y.o.   MRN: [id number]  Rash Pertinent negatives include no cough, diarrhea, fatigue, fever, shortness of breath or vomiting.   Also concerned R pelvic area "marble" present x 1 week - ?enlarging size - FH cancer so concerned with same  Past Medical History  Diagnosis Date  . SMOKER   . LLQ abdominal pain 05/03/2011    10/12 - seems to be related to a ruptured ovarian cyst - see below: IMPRESSION - abd CT 8/12: Dominant 2.5 cm follicle in the left ovary.  Otherwise, no CT findings to account for the patient's abdominal  complaints.  Original Report Authenticated By: Charline Bills, M.D.  We will get labs. Toradol IM Start Hydrocodone, Ibuprofen. To ER if sick    . Diverticulitis of colon 06/24/2011    Oct 2012 by CT with microperf     Review of Systems  Constitutional: Negative for fever, fatigue and unexpected weight change.  Respiratory: Negative for cough and shortness of breath.   Gastrointestinal: Negative for nausea, vomiting, diarrhea, constipation and blood in stool.  Skin: Positive for rash.        Objective:   Physical Exam BP 120/80  Pulse 71  Temp(Src) 98.7 F (37.1 C) (Oral)  Wt 142 lb 1.9 oz (64.465 kg)  SpO2 98% Wt Readings from Last 3 Encounters:  07/13/13 142 lb 1.9 oz (64.465 kg)  06/24/11 137 lb 6.4 oz (62.324 kg)  05/05/11 132 lb 3.2 oz (59.966 kg)   Constitutional: She appears well-developed and well-nourished. No distress.  Neck: Normal range of motion. Neck supple. No JVD present. No thyromegaly present.  Cardiovascular: Normal rate, regular rhythm and normal heart sounds.  No murmur heard. No BLE edema. Pulmonary/Chest: Effort normal and breath sounds normal. No respiratory distress. She has no wheezes.  Abdominal: Soft. Bowel sounds are normal. She exhibits no distension. There is no tenderness. no masses. Prominent but not abnormally enlarged lymph node appreciated right  inguinal region. Symmetric on left GU: defer to GYN Skin: Mild atopic dermatitis on back, updated by dry skin. Diffusely, skin is warm and dry. No other rash noted. No erythema.  Psychiatric: She has a normal mood and affect. Her behavior is normal. Judgment and thought content normal.   Lab Results  Component Value Date   WBC 14.0* 05/03/2011   HGB 14.3 05/03/2011   HCT 42.1 05/03/2011   PLT 295.0 05/03/2011   GLUCOSE 87 05/03/2011   CHOL 102 09/04/2010   TRIG 26.0 09/04/2010   HDL 52.70 09/04/2010   LDLCALC 44 09/04/2010   ALT 19 05/03/2011   AST 15 05/03/2011   NA 139 05/03/2011   K 3.8 05/03/2011   CL 103 05/03/2011   CREATININE 0.8 05/03/2011   BUN 11 05/03/2011   CO2 26 05/03/2011   TSH 1.18 09/04/2010        Assessment & Plan:   r groin "mass" - suspect LN given location at inguinal area  pelvic exam deferred Refer for pelvic ultrasound given FH ca - encouraged gyn follow up for PAP/pelvic  Rash - ectopic dermatitis rather than shingles on exam - topical triamcin - pt to call if worse or unimproved

## 2013-07-13 NOTE — Progress Notes (Signed)
Pre-visit discussion using our clinic review tool. No additional management support is needed unless otherwise documented below in the visit note.  

## 2013-07-16 ENCOUNTER — Encounter: Payer: Self-pay | Admitting: Internal Medicine

## 2013-07-16 ENCOUNTER — Ambulatory Visit
Admission: RE | Admit: 2013-07-16 | Discharge: 2013-07-16 | Disposition: A | Discharge status: Home / Self Care | Payer: BC Managed Care – PPO | Source: Ambulatory Visit | Attending: Internal Medicine | Admitting: Internal Medicine

## 2013-07-16 ENCOUNTER — Other Ambulatory Visit: Payer: Self-pay | Admitting: Internal Medicine

## 2013-07-16 DIAGNOSIS — R1900 Intra-abdominal and pelvic swelling, mass and lump, unspecified site: Secondary | ICD-10-CM

## 2013-07-16 DIAGNOSIS — Z1239 Encounter for other screening for malignant neoplasm of breast: Secondary | ICD-10-CM

## 2013-12-14 ENCOUNTER — Encounter: Payer: Self-pay | Admitting: Internal Medicine

## 2014-02-21 ENCOUNTER — Ambulatory Visit (INDEPENDENT_AMBULATORY_CARE_PROVIDER_SITE_OTHER): Payer: BC Managed Care – PPO | Admitting: Nurse Practitioner

## 2014-02-21 ENCOUNTER — Encounter: Payer: Self-pay | Admitting: Nurse Practitioner

## 2014-02-21 ENCOUNTER — Encounter: Payer: Self-pay | Admitting: *Deleted

## 2014-02-21 VITALS — BP 122/84 | HR 76 | Temp 98.0°F | Wt 151.8 lb

## 2014-02-21 DIAGNOSIS — M542 Cervicalgia: Secondary | ICD-10-CM

## 2014-02-21 DIAGNOSIS — M62838 Other muscle spasm: Secondary | ICD-10-CM | POA: Insufficient documentation

## 2014-02-21 MED ORDER — CYCLOBENZAPRINE HCL 10 MG PO TABS: ORAL_TABLET | ORAL | Status: DC

## 2014-02-21 MED ORDER — MELOXICAM 7.5 MG PO TABS: ORAL_TABLET | ORAL | Status: DC

## 2014-02-21 NOTE — Patient Instructions (Addendum)
Call clinic or go to ER if pain worsens. Call clinic if symptoms worsen or not resolved in 7 days. Call clinic with questions or concerns   Muscle Cramps and Spasms Muscle cramps and spasms occur when a muscle or muscles tighten and you have no control over this tightening (involuntary muscle contraction). They are a common problem and can develop in any muscle. The most common place is in the calf muscles of the leg. Both muscle cramps and muscle spasms are involuntary muscle contractions, but they also have differences:   Muscle cramps are sporadic and painful. They may last a few seconds to a quarter of an hour. Muscle cramps are often more forceful and last longer than muscle spasms.  Muscle spasms may or may not be painful. They may also last just a few seconds or much longer. CAUSES  It is uncommon for cramps or spasms to be due to a serious underlying problem. In many cases, the cause of cramps or spasms is unknown. Some common causes are:   Overexertion.   Overuse from repetitive motions (doing the same thing over and over).   Remaining in a certain position for a long period of time.   Improper preparation, form, or technique while performing a sport or activity.   Dehydration.   Injury.   Side effects of some medicines.   Abnormally low levels of the salts and ions in your blood (electrolytes), especially potassium and calcium. This could happen if you are taking water pills (diuretics) or you are pregnant.  Some underlying medical problems can make it more likely to develop cramps or spasms. These include, but are not limited to:   Diabetes.   Parkinson disease.   Hormone disorders, such as thyroid problems.   Alcohol abuse.   Diseases specific to muscles, joints, and bones.   Blood vessel disease where not enough blood is getting to the muscles.  HOME CARE INSTRUCTIONS   Stay well hydrated. Drink enough water and fluids to keep your urine clear or  pale yellow.  It may be helpful to massage, stretch, and relax the affected muscle.  For tight or tense muscles, use a warm towel, heating pad, or hot shower water directed to the affected area.  If you are sore or have pain after a cramp or spasm, applying ice to the affected area may relieve discomfort.  Put ice in a plastic bag.  Place a towel between your skin and the bag.  Leave the ice on for 15-20 minutes, 03-04 times a day.  Medicines used to treat a known cause of cramps or spasms may help reduce their frequency or severity. Only take over-the-counter or prescription medicines as directed by your caregiver. SEEK MEDICAL CARE IF:  Your cramps or spasms get more severe, more frequent, or do not improve over time.  MAKE SURE YOU:   Understand these instructions.  Will watch your condition.  Will get help right away if you are not doing well or get worse. Document Released: 01/01/2002 Document Revised: 11/06/2012 Document Reviewed: 06/28/2012 [facility] Patient Information 2015 [employer] [place]. This information is not intended to replace advice given to you by your health care provider. Make sure you discuss any questions you have with your health care provider.

## 2014-02-21 NOTE — Progress Notes (Signed)
Pre visit review using our clinic review tool, if applicable. No additional management support is needed unless otherwise documented below in the visit note. 

## 2014-02-21 NOTE — Progress Notes (Signed)
Subjective:    Patient ID: Heidi Gordon, female    DOB: 12-20-1965, 48 y.o.   MRN: [id number]  HPIPatient presents in clinic today with 4 day history of neck and left shoulder pain.  Patient reports moving a heavy table on Saturday and then awakening on Sunday with stiff neck.  Symptoms of pain now level 7-8/10 progressively worsening over past few days.  Patient works at computer for employment.  She states she has a small amount of tingling a times in left hand.  Unable to turn neck or raise left shoulder due to pain.   Has taken Advil at home with little relief.    Review of Systems  Constitutional: Negative.  Negative for fever, chills and fatigue.  HENT: Negative.   Respiratory: Negative.  Negative for cough and shortness of breath.   Cardiovascular: Negative.   Musculoskeletal: Positive for neck pain and neck stiffness.       Patient  With 5 day history of neck stiffness and pain noted 1 day after lifting table.  Pt with limited neck and left shoulder movement due to pain in left neck.  Denies weakness in upper extremities.  Skin: Negative.   Neurological: Negative.  Negative for dizziness, weakness, light-headedness, numbness and headaches.  Psychiatric/Behavioral: Negative.  Negative for agitation.   Past Medical History  Diagnosis Date  . SMOKER   . LLQ abdominal pain     10 /12 - seems to be related to a ruptured ovarian cyst - see below: IMPRESSION - abd CT 8/12: Dominant 2.5 cm follicle in the left ovary.  Otherwise, no CT findings to account for the patient's abdominal  complaints.  Original Report Authenticated By: Charline Bills, M.D.  We will get labs. Toradol IM Start Hydrocodone, Ibuprofen. To ER if sick    . Diverticulitis of colon     Oct 2012 by CT with microperf     History   Social History  . Marital Status: Single    Spouse Name: N/A    Number of Children: N/A  . Years of Education: N/A   Occupational History  . Not on file.   Social History Main  Topics  . Smoking status: Current Every Day Smoker  . Smokeless tobacco: Not on file  . Alcohol Use: No  . Drug Use: No  . Sexual Activity: Not on file   Other Topics Concern  . 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 Cesarean section  2006    Family History  Problem Relation Age of Onset  . Cancer Father     Anal  . Diabetes Father   . Colon polyps Father   . Colon cancer Father   . Alcohol abuse Other   . Hypertension Other   . Ovarian cancer Sister     Allergies  Allergen Reactions  . Penicillins     No current outpatient prescriptions on file prior to visit.   No current facility-administered medications on file prior to visit.    BP 122/84  Pulse 76  Temp(Src) 98 F (36.7 C) (Oral)  Wt 151 lb 12.8 oz (68.856 kg)  SpO2 98%       Objective:   Physical Exam  Constitutional: She appears well-developed and well-nourished. No distress.  HENT:  Head: Normocephalic.  Eyes: Pupils are equal, round, and reactive to  light.  Neck:  Neck with limited ROM in all quadrants due to pain in left neck and shoulder.  Limited forward flexion 15 degrees, and pain with rotation of neck from side to side limited 30 degrees to left. Right shoulder with good ROM.  Left shoulder limited to 90 degrees due to pain.  Denies tingling in left hand at present.  Hand grasp equal and strong.          Assessment & Plan:  1. Cervicalgia Rest, Alternate heat and ice. Take medications as prescribed. See patient instructions.  Go to ER if symptoms or pain worsen .  Call clinic if symptoms not imrpoved in 7 days or worsen.   - meloxicam (MOBIC) 7.5 MG tablet; Take 1 or 2 tablets daily as needed for pain  Dispense: 30 tablet; Refill: 0  2. Trapezius muscle spasm Caution with taking this medication and driving or operating heavy equipment.  Counseling given to patient.   - cyclobenzaprine (FLEXERIL) 10 MG tablet; Take 1/2 - 1 tablet po 3 times daily as needed for muscle spasm.  Dispense: 30 tablet; Refill: 0  Problem List Items Addressed This Visit   Cervicalgia - Primary   Relevant Medications      meloxicam (MOBIC) tablet   Trapezius muscle spasm   Relevant Medications      cyclobenzaprine (FLEXERIL) tablet

## 2014-05-22 ENCOUNTER — Emergency Department (HOSPITAL_COMMUNITY): Payer: BC Managed Care – PPO

## 2014-05-22 ENCOUNTER — Telehealth: Payer: Self-pay | Admitting: Internal Medicine

## 2014-05-22 ENCOUNTER — Emergency Department (HOSPITAL_COMMUNITY)
Admission: EM | Admit: 2014-05-22 | Discharge: 2014-05-22 | Disposition: A | Discharge status: Home / Self Care | Payer: BC Managed Care – PPO | Attending: Emergency Medicine | Admitting: Emergency Medicine

## 2014-05-22 ENCOUNTER — Encounter (HOSPITAL_COMMUNITY): Payer: Self-pay | Admitting: Emergency Medicine

## 2014-05-22 DIAGNOSIS — Z720 Tobacco use: Secondary | ICD-10-CM | POA: Diagnosis not present

## 2014-05-22 DIAGNOSIS — Z8719 Personal history of other diseases of the digestive system: Secondary | ICD-10-CM | POA: Diagnosis not present

## 2014-05-22 DIAGNOSIS — Z880 Allergy status to penicillin: Secondary | ICD-10-CM | POA: Insufficient documentation

## 2014-05-22 DIAGNOSIS — R079 Chest pain, unspecified: Secondary | ICD-10-CM | POA: Diagnosis present

## 2014-05-22 DIAGNOSIS — R110 Nausea: Secondary | ICD-10-CM | POA: Insufficient documentation

## 2014-05-22 DIAGNOSIS — R0602 Shortness of breath: Secondary | ICD-10-CM | POA: Diagnosis not present

## 2014-05-22 LAB — CBC
HCT: 43.3 % (ref 36.0–46.0)
Hemoglobin: 14.6 g/dL (ref 12.0–15.0)
MCH: 31.3 pg (ref 26.0–34.0)
MCHC: 33.7 g/dL (ref 30.0–36.0)
MCV: 92.7 fL (ref 78.0–100.0)
PLATELETS: 297 10*3/uL (ref 150–400)
RBC: 4.67 MIL/uL (ref 3.87–5.11)
RDW: 13.1 % (ref 11.5–15.5)
WBC: 7.5 10*3/uL (ref 4.0–10.5)

## 2014-05-22 LAB — BASIC METABOLIC PANEL
Anion gap: 14 (ref 5–15)
BUN: 9 mg/dL (ref 6–23)
CALCIUM: 9.5 mg/dL (ref 8.4–10.5)
CO2: 23 mEq/L (ref 19–32)
CREATININE: 0.63 mg/dL (ref 0.50–1.10)
Chloride: 99 mEq/L (ref 96–112)
GLUCOSE: 73 mg/dL (ref 70–99)
Potassium: 4.3 mEq/L (ref 3.7–5.3)
Sodium: 136 mEq/L — ABNORMAL LOW (ref 137–147)

## 2014-05-22 LAB — I-STAT TROPONIN, ED
TROPONIN I, POC: 0 ng/mL (ref 0.00–0.08)
TROPONIN I, POC: 0 ng/mL (ref 0.00–0.08)

## 2014-05-22 LAB — D-DIMER, QUANTITATIVE: D-Dimer, Quant: 0.27 ug/mL-FEU (ref 0.00–0.48)

## 2014-05-22 MED ORDER — ASPIRIN 81 MG PO CHEW
324.0000 mg | CHEWABLE_TABLET | Freq: Once | ORAL | Status: AC
  Administered 2014-05-22: 324 mg via ORAL
  Filled 2014-05-22: qty 4

## 2014-05-22 MED ORDER — HYDROCODONE-ACETAMINOPHEN 5-325 MG PO TABS
1.0000 | ORAL_TABLET | Freq: Once | ORAL | Status: AC
  Administered 2014-05-22: 1 via ORAL
  Filled 2014-05-22: qty 1

## 2014-05-22 MED ORDER — GI COCKTAIL ~~LOC~~
30.0000 mL | Freq: Once | ORAL | Status: AC
  Administered 2014-05-22: 30 mL via ORAL
  Filled 2014-05-22: qty 30

## 2014-05-22 MED ORDER — ALBUTEROL SULFATE HFA 108 (90 BASE) MCG/ACT IN AERS: 2.0000 | INHALATION_SPRAY | RESPIRATORY_TRACT | Status: AC | PRN

## 2014-05-22 MED ORDER — FAMOTIDINE 20 MG PO TABS
20.0000 mg | ORAL_TABLET | Freq: Once | ORAL | Status: AC
  Administered 2014-05-22: 20 mg via ORAL
  Filled 2014-05-22: qty 1

## 2014-05-22 MED ORDER — ONDANSETRON 4 MG PO TBDP
4.0000 mg | ORAL_TABLET | Freq: Once | ORAL | Status: AC
  Administered 2014-05-22: 4 mg via ORAL
  Filled 2014-05-22: qty 1

## 2014-05-22 NOTE — Telephone Encounter (Signed)
Patient Information:  Caller Name: Toniann FailWendy  Phone: [phone]  Patient: Heidi Gordon, Heidi Gordon  Gender: Female  DOB: [birth year]  Age: [age] Years  PCP: Rene PaciLeschber, Valerie (Adults only)  Pregnant: No  Office Follow Up:  Does the office need to follow up with this patient?: No  Instructions For The Office: N/A  RN Note:  [place] Advised to go to the ED there. Pt. states she would rather drive home and be seen. Advised the pt. this is not safe. Pt. states she feels like she can do it.Unable to talk her out of it. Advised to pull over and call 911, if any issues or worsening of symptoms.  Symptoms  Reason For Call & Symptoms: Onset of chest pain on 05/21/14. Numbness of the Rt. arm. No cold, clammy sweats. Chest pain radiates to neck. Feels some palpitations. No pressure or squeezing sensation. States only some mild SOB.  Reviewed Health History In EMR: Yes  Reviewed Medications In EMR: Yes  Reviewed Allergies In EMR: Yes  Reviewed Surgeries / Procedures: Yes  Date of Onset of Symptoms: 05/21/2014 OB / GYN:  LMP: 05/08/2014  Guideline(s) Used:  Chest Pain  Disposition Per Guideline:   Go to ED Now (or to Office with PCP Approval)  Reason For Disposition Reached:   Chest pain lasting longer than 5 minutes  Advice Given:  Call Back If:  Severe chest pain  Difficulty breathing  You become worse.  Patient Will Follow Care Advice:  YES

## 2014-05-22 NOTE — Discharge Instructions (Signed)
Please follow the directions provided.  Be sure to follow-up with your primary care provider for further evaluation of your chest pain.  Today we checked two sets of labs for your heart, and a lab to check for clots in your lungs and all were negative. You may use the inhaler  2 puffs every 4 hours as needed for shortness of breath.     SEEK IMMEDIATE MEDICAL CARE IF:  You have increased chest pain or pain that spreads to your arm, neck, jaw, back, or abdomen.  You have shortness of breath.  You have an increasing cough, or you cough up blood.  You have severe back or abdominal pain.  You feel nauseous or vomit.  You have severe weakness.  You faint.  You have chills. This is an emergency. Do not wait to see if the pain will go away. Get medical help at once. Call your local emergency services (911 in [place] Do not drive yourself to the hospital.

## 2014-05-22 NOTE — ED Notes (Signed)
Elizabeth, PA at bedside.  

## 2014-05-22 NOTE — ED Provider Notes (Signed)
CSN: [id number]     Arrival date & time 05/22/14  1451 History   First MD Initiated Contact with Patient 05/22/14 1656     Chief Complaint  Patient presents with  . Chest Pain   (Consider location/radiation/quality/duration/timing/severity/associated sxs/prior Treatment) HPI Heidi Gordon is a 48 year old female present into the ED with report of left-sided chest pain onset 1 day. She reports driving last night when the pain started. She she describes the pain as a dull ache and has associated nausea and shortness of breath with the pain. The pain initially went away but once at home she had a recurrence of the same pain. There is no associated activity that makes the pain better or worse. She had a recurrence of the pain 2 or 3 episodes today while at work.  She denies any recent fevers, colds, hemoptysis, recent surgeries or unilater leg swelling.  She does smoke 1/2 pack per day.    Past Medical History  Diagnosis Date  . SMOKER   . LLQ abdominal pain     10/12 - seems to be related to a ruptured ovarian cyst - see below: IMPRESSION - abd CT 8/12: Dominant 2.5 cm follicle in the left ovary.  Otherwise, no CT findings to account for the patient's abdominal  complaints.  Original Report Authenticated By: Charline BillsSRIYESH KRISHNAN, M.D.  We will get labs. Toradol IM Start Hydrocodone, Ibuprofen. To ER if sick    . Diverticulitis of colon     Oct 2012 by CT with microperf    Past Surgical History  Procedure Laterality Date  . Cesarean section  2006   Family History  Problem Relation Age of Onset  . Cancer Father     Anal  . Diabetes Father   . Colon polyps Father   . Colon cancer Father   . Alcohol abuse Other   . Hypertension Other   . Ovarian cancer Sister    History  Substance Use Topics  . Smoking status: Current Every Day Smoker  . Smokeless tobacco: Not on file  . Alcohol Use: No   OB History   Grav Para Term Preterm Abortions TAB SAB Ect Mult Living                  Review of Systems  Constitutional: Negative for fever and chills.  HENT: Negative for sore throat.   Eyes: Negative for visual disturbance.  Respiratory: Positive for shortness of breath. Negative for cough.   Cardiovascular: Positive for chest pain. Negative for leg swelling.  Gastrointestinal: Positive for nausea. Negative for vomiting and diarrhea.  Genitourinary: Negative for dysuria.  Musculoskeletal: Negative for myalgias.  Skin: Negative for rash.  Neurological: Negative for weakness, numbness and headaches.    Allergies  Penicillins  Home Medications   Prior to Admission medications   Medication Sig Start Date End Date Taking? Authorizing Provider  ibuprofen (ADVIL,MOTRIN) 200 MG tablet Take 400-600 mg by mouth every 6 (six) hours as needed for headache.   Yes Historical Provider, MD   BP 102/59  Pulse 75  Temp(Src) 98.2 F (36.8 C) (Oral)  Resp 17  Ht 5\' 9"  (1.753 m)  Wt 153 lb (69.4 kg)  BMI 22.58 kg/m2  SpO2 100%  LMP 05/15/2014 Physical Exam  Nursing note and vitals reviewed. Constitutional: She appears well-developed and well-nourished. No distress.  HENT:  Head: Normocephalic and atraumatic.  Mouth/Throat: Oropharynx is clear and moist. No oropharyngeal exudate.  Eyes: Conjunctivae are normal.  Neck: Neck supple. No  thyromegaly present.  Cardiovascular: Normal rate, regular rhythm and intact distal pulses.   Pulmonary/Chest: Effort normal. No respiratory distress. She has decreased breath sounds in the right lower field and the left lower field. She has no wheezes. She has no rhonchi. She has no rales. She exhibits tenderness.    Abdominal: Soft. There is no tenderness.  Musculoskeletal: She exhibits no tenderness.  Lymphadenopathy:    She has no cervical adenopathy.  Neurological: She is alert.  Skin: Skin is warm and dry. No rash noted. She is not diaphoretic.  Psychiatric: She has a normal mood and affect.    ED Course  Procedures (including  critical care time) Labs Review Labs Reviewed  BASIC METABOLIC PANEL - Abnormal; Notable for the following:    Sodium 136 (*)    All other components within normal limits  CBC  D-DIMER, QUANTITATIVE  I-STAT TROPOININ, ED  Rosezena SensorI-STAT TROPOININ, ED    Imaging Review Dg Chest 2 View  05/22/2014   CLINICAL DATA:  Left-sided chest pain and dizziness.  EXAM: CHEST  2 VIEW  COMPARISON:  None.  FINDINGS: Normal heart size and mediastinal contours. No acute infiltrate or edema. No effusion or pneumothorax. No acute osseous findings.  IMPRESSION: No active cardiopulmonary disease.   Electronically Signed   By: Tiburcio PeaJonathan  Watts M.D.   On: 05/22/2014 17:32     EKG Interpretation   Date/Time:  Wednesday May 22 2014 14:55:55 EDT Ventricular Rate:  79 PR Interval:  134 QRS Duration: 74 QT Interval:  370 QTC Calculation: 424 R Axis:   65 Text Interpretation:  Normal sinus rhythm Normal ECG agree. normal  Confirmed by Donnald GarrePfeiffer, MD, Lebron ConnersMarcy [phone]([id number]) on 05/22/2014 6:29:48 PM      MDM   Final diagnoses:  Chest pain, unspecified chest pain type   48 yo female presenting with intermittent chest pain onset last night.  Chest pain is not likely of cardiac or pulmonary etiology d/t presentation, perc negative, VSS, no tracheal deviation, no JVD or new murmur, RRR, breath sounds equal bilaterally, EKG without acute abnormalities, negative troponin and negative d-dimer, and negative CXR. Patient is to be discharged with recommendation to follow up with PCP in regards to today's hospital visit.  Discharge instructions include prescription for albuterol MDI to use during next episode of shortness of breath. She has been advised to return if CP becomes exertional, associated with diaphoresis or nausea, radiates to left jaw/arm, worsens or becomes concerning in any way. Pt appears reliable for follow up and is agreeable to discharge.   Case has been discussed with and seen by Dr. Donnald GarrePfeiffer who agrees with the  above plan to discharge.      Filed Vitals:   05/22/14 1937 05/22/14 2000 05/22/14 2030 05/22/14 2035  BP: 100/51 107/70 108/74 107/66  Pulse: 54 61 59 66  Temp:    98.1 F (36.7 C)  TempSrc:    Oral  Resp: 19 21  13   Height:      Weight:      SpO2: 97% 99% 99% 99%    Meds given in ED:  Medications  aspirin chewable tablet 324 mg (324 mg Oral Given 05/22/14 1853)  famotidine (PEPCID) tablet 20 mg (20 mg Oral Given 05/22/14 1853)  gi cocktail (Maalox,Lidocaine,Donnatal) (30 mLs Oral Given 05/22/14 2024)  HYDROcodone-acetaminophen (NORCO/VICODIN) 5-325 MG per tablet 1 tablet (1 tablet Oral Given 05/22/14 2024)  ondansetron (ZOFRAN-ODT) disintegrating tablet 4 mg (4 mg Oral Given 05/22/14 2024)    Discharge Medication List  as of 05/22/2014  9:01 PM    START taking these medications   Details  albuterol (PROVENTIL HFA;VENTOLIN HFA) 108 (90 BASE) MCG/ACT inhaler Inhale 2 puffs into the lungs every 4 (four) hours as needed for wheezing or shortness of breath., Starting 05/22/2014, Until Discontinued, Print         Harle Battiest, NP 05/23/14 1344

## 2014-05-22 NOTE — ED Notes (Signed)
Per pt sts that last night she began having chest pressure and some tingling in her right fingers. sts some slight SOB and nausea. Denies cough or fever. Denies recent travels.

## 2014-05-22 NOTE — ED Notes (Signed)
Patient transported to X-ray 

## 2014-05-23 ENCOUNTER — Telehealth: Payer: Self-pay | Admitting: Internal Medicine

## 2014-05-23 NOTE — Telephone Encounter (Signed)
[facility]. Pt had xrays and blood work and pt states they turned out ok. Pt was advised to touch base with office to see if/what further testing should be done.  [phone]

## 2014-05-23 NOTE — Telephone Encounter (Signed)
Pt notified she can make appt with MD or colleague at this office, she will call back in the future and set up if she deems necessary.

## 2014-05-23 NOTE — Telephone Encounter (Signed)
emmi emailed °

## 2014-05-23 NOTE — Telephone Encounter (Signed)
Pt need to make ER follow-up with md or colleague...Raechel Chute/lmb

## 2014-05-25 NOTE — ED Provider Notes (Signed)
Medical screening examination/treatment/procedure(s) were performed by non-physician practitioner and as supervising physician I was immediately available for consultation/collaboration.   EKG Interpretation   Date/Time:  Wednesday May 22 2014 14:55:55 EDT Ventricular Rate:  79 PR Interval:  134 QRS Duration: 74 QT Interval:  370 QTC Calculation: 424 R Axis:   65 Text Interpretation:  Normal sinus rhythm Normal ECG agree. normal  Confirmed by Donnald GarrePfeiffer, MD, Lebron ConnersMarcy [phone]) on 05/22/2014 6:29:48 PM       Arby BarretteMarcy Lawanda Holzheimer, MD 05/25/14 [phone]

## 2015-04-02 IMAGING — US US PELVIS LIMITED
1 series · 14 of 19 positions shown · non-contrast
Comparison: None.

CLINICAL DATA: Pelvic pain with a mass in the right groin.

EXAM:
US PELVIS LIMITED
TECHNIQUE: Ultrasound examination of the pelvic soft tissues was performed in
the area of clinical concern.

[Series 1: us pelvis limited · 0.06mm/px · 14 of 19 slices shown]
[im 1/19]
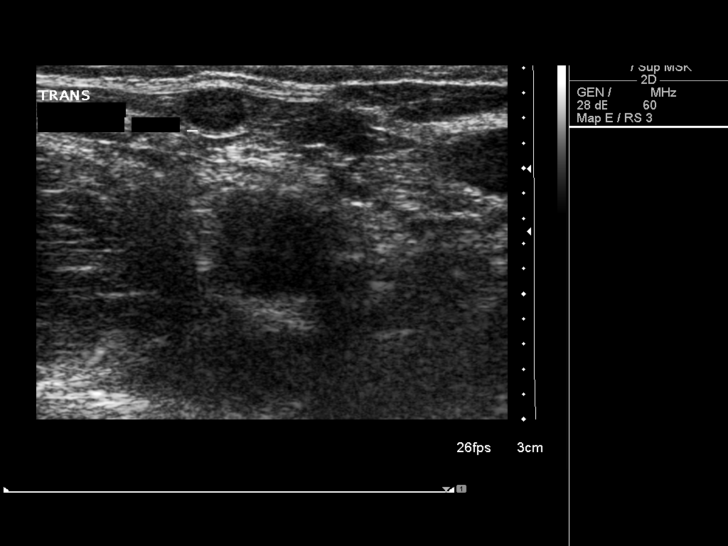
[im 3/19]
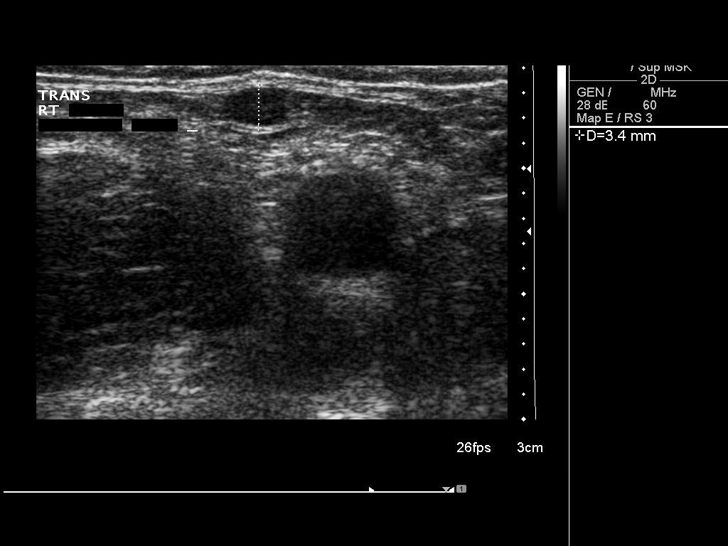
[im 4/19]
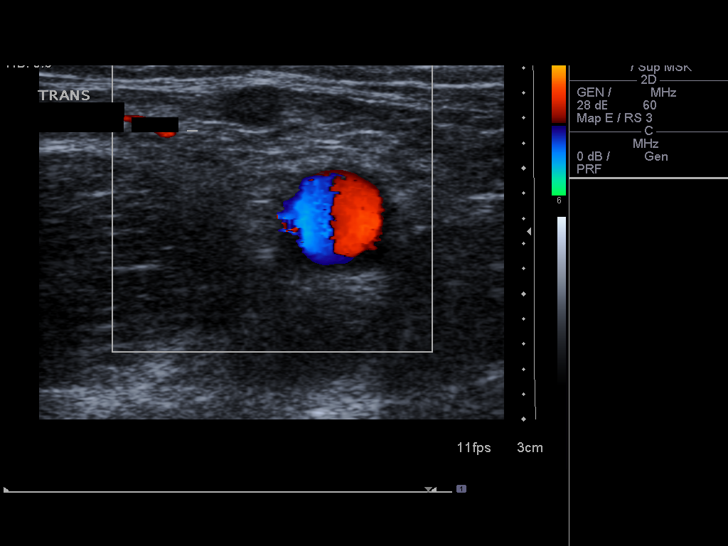
[im 5/19]
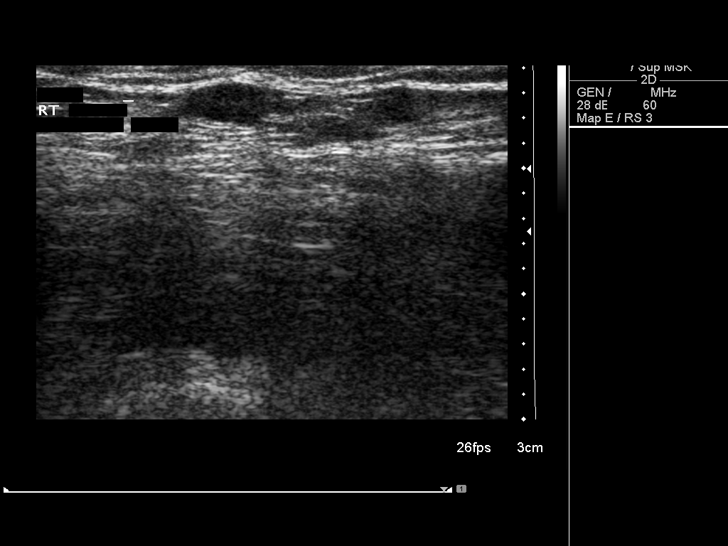
[im 7/19]
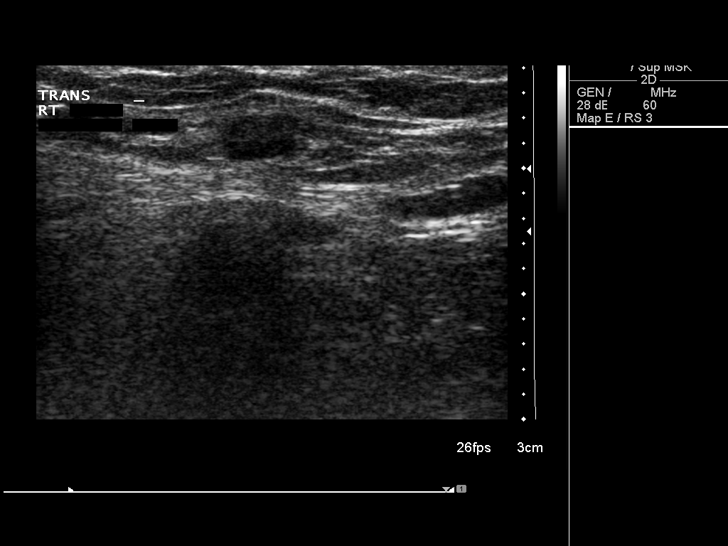
[im 8/19]
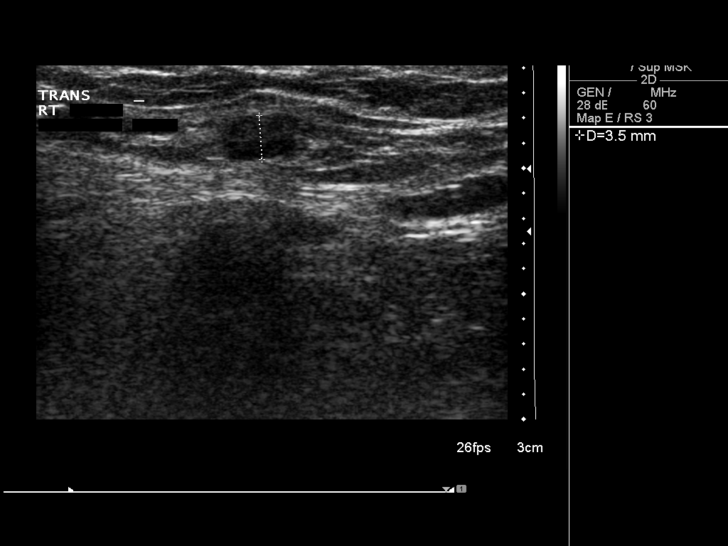
[im 9/19]
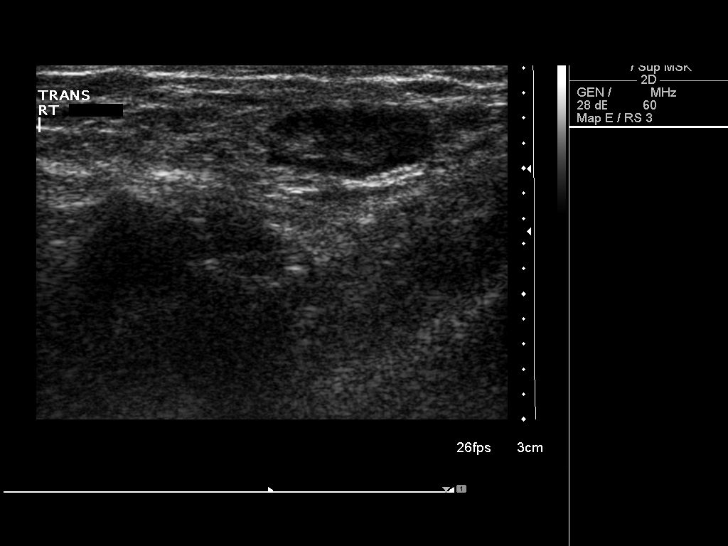
[im 11/19]
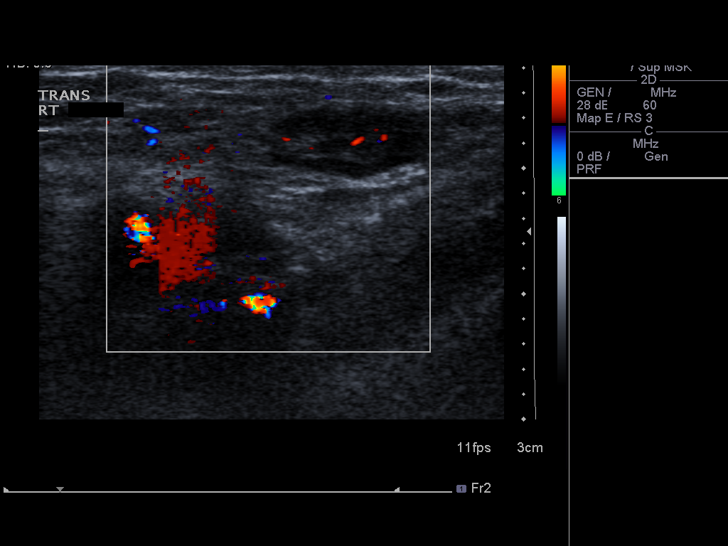
[im 12/19]
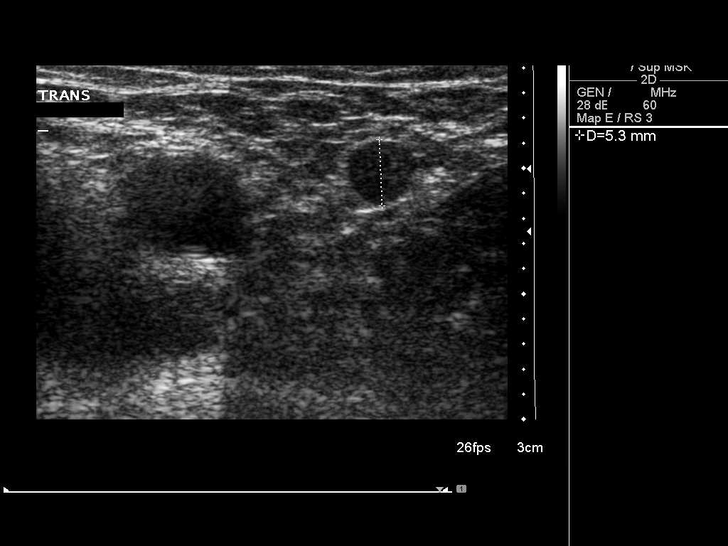
[im 13/19]
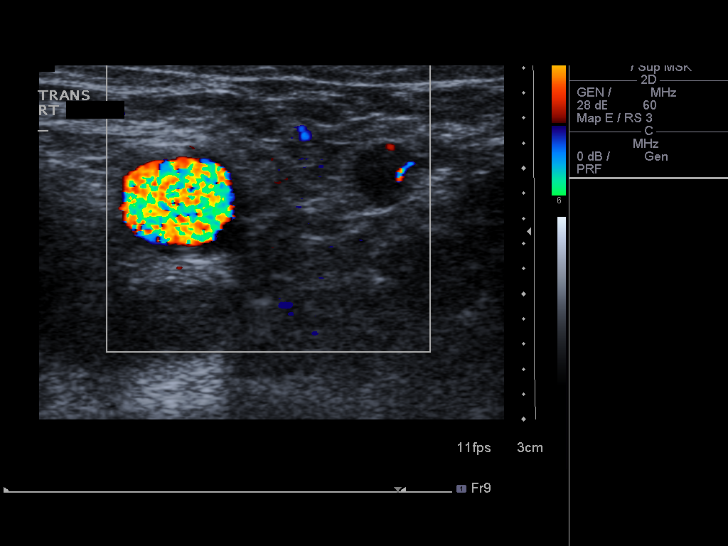
[im 15/19]
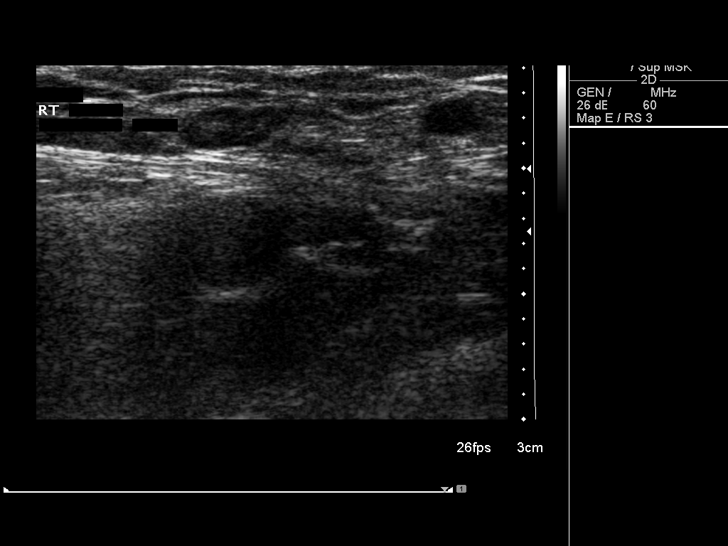
[im 16/19]
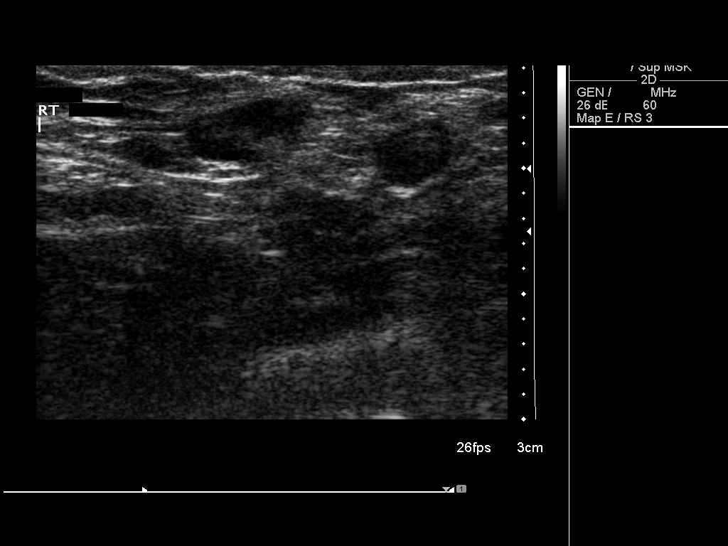
[im 17/19]
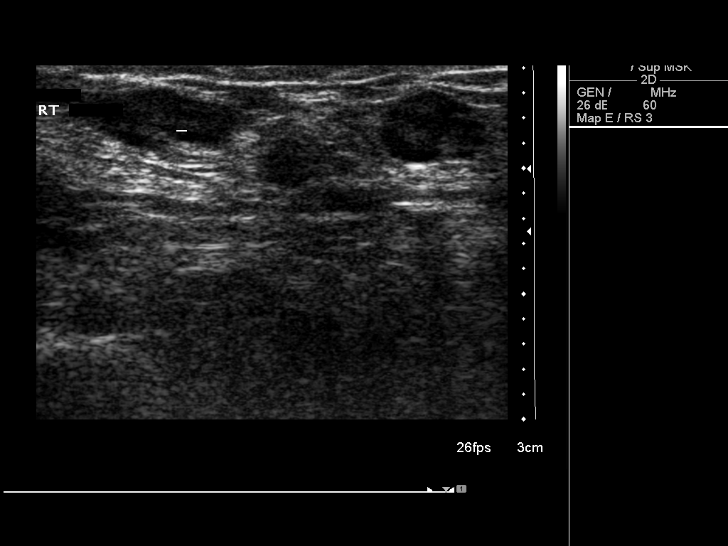
[im 19/19]
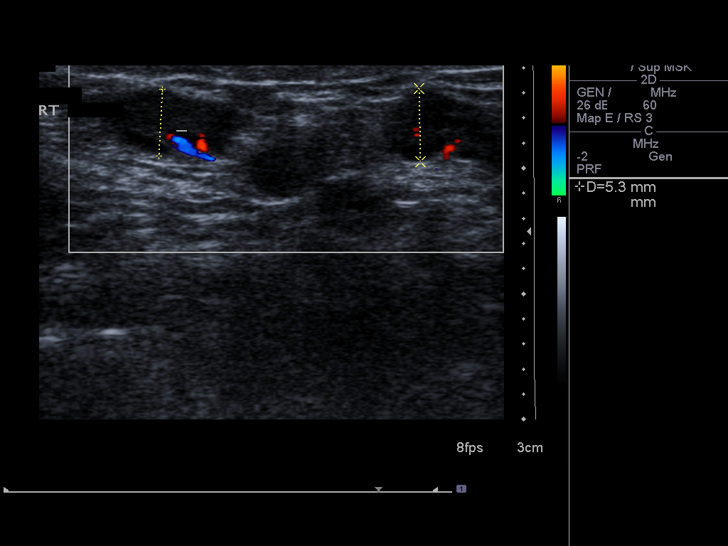

[14 of 19 positions shown; findings below may reference images not displayed]

FINDINGS: Ultrasound was performed over the palpable area. There is a
superficial lymph nodes less than 3 mm in short axis. There are
several other small lymph nodes with a short axis maximum of 6 mm.
No other abnormalities.
IMPRESSION: Multiple small lymph nodes in the right groin. The palpable
abnormality represents a 3 mm lymph node.

## 2016-02-06 IMAGING — CR DG CHEST 2V
2 series · 2 of 2 positions shown · non-contrast
Comparison: None.

CLINICAL DATA: Left-sided chest pain and dizziness.

EXAM:
CHEST  2 VIEW

[w chest pa]
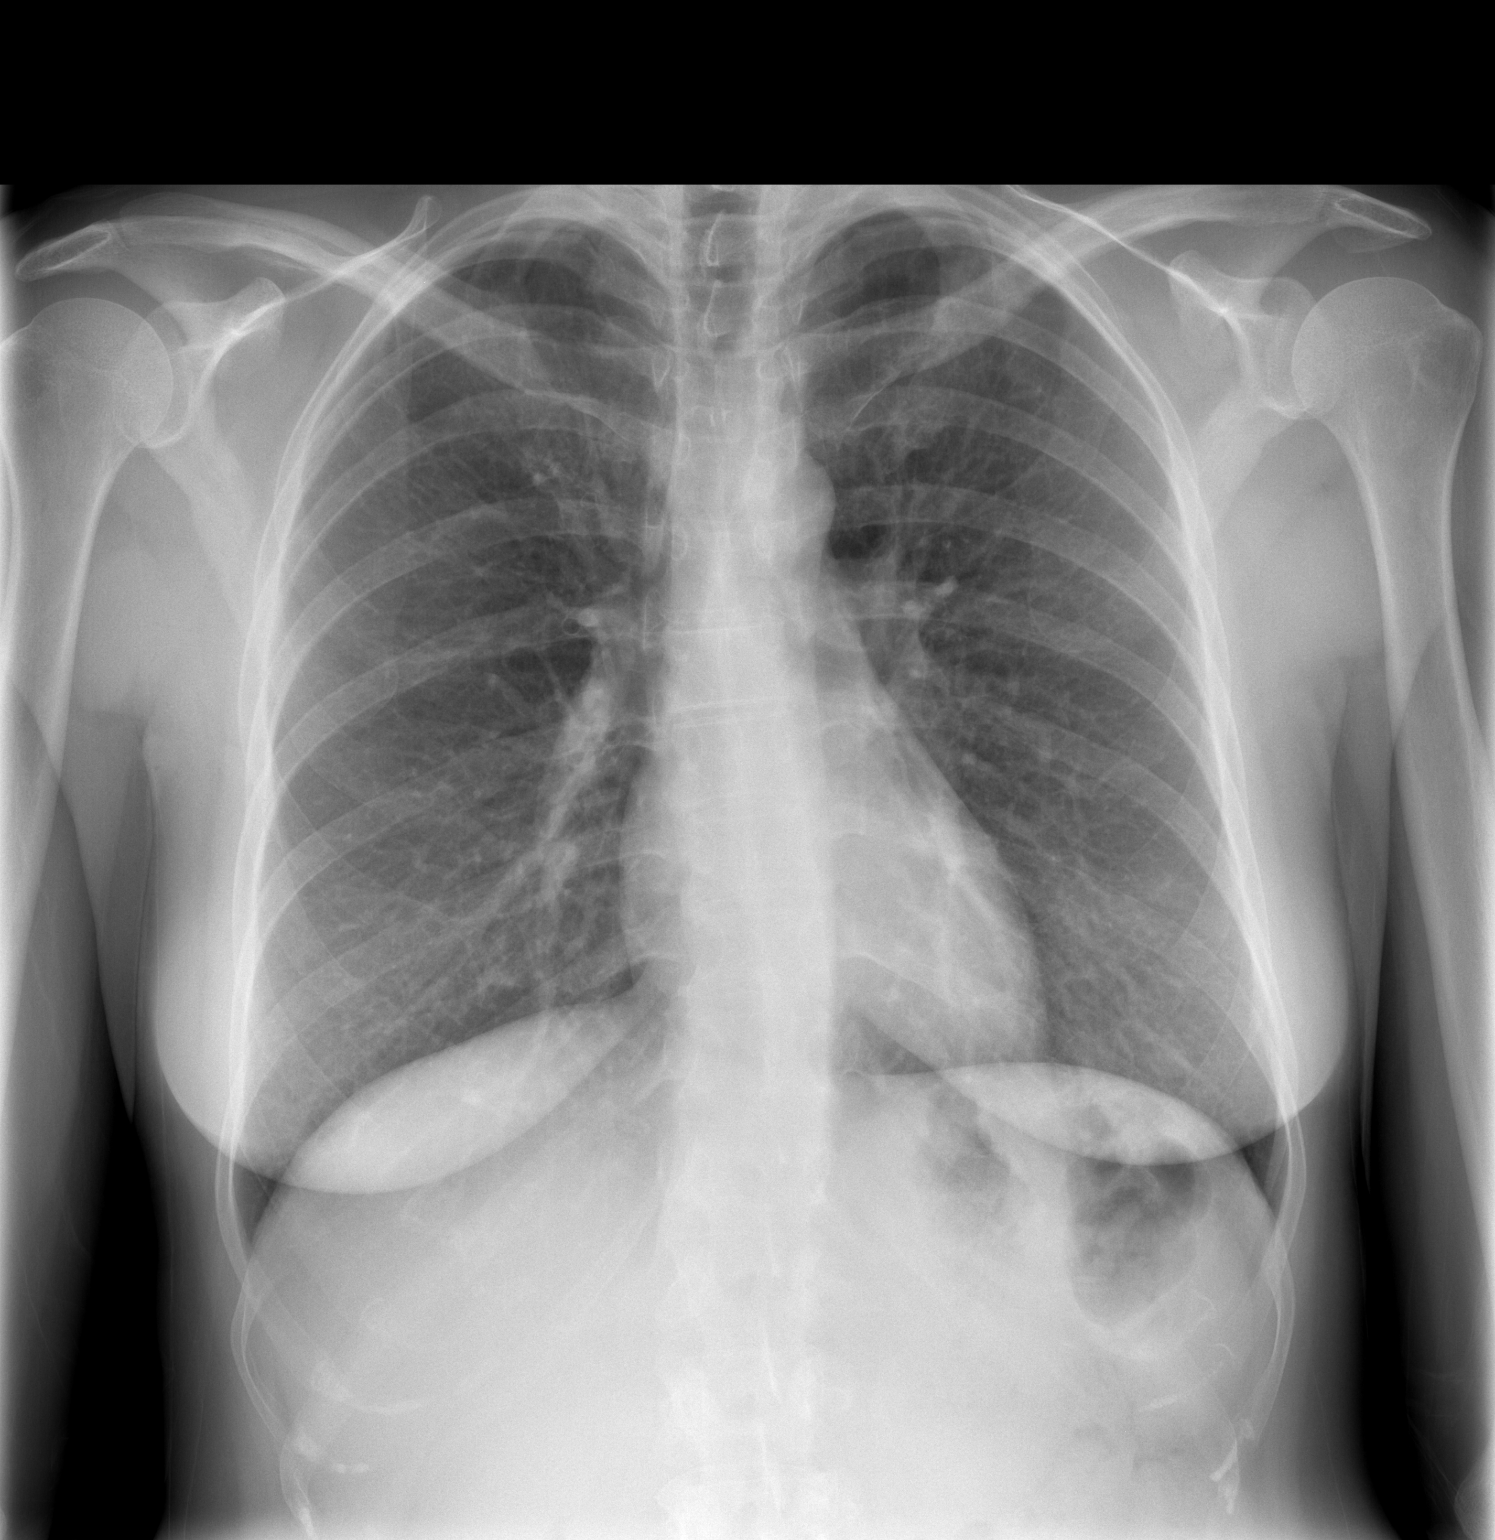

[w chest lat]
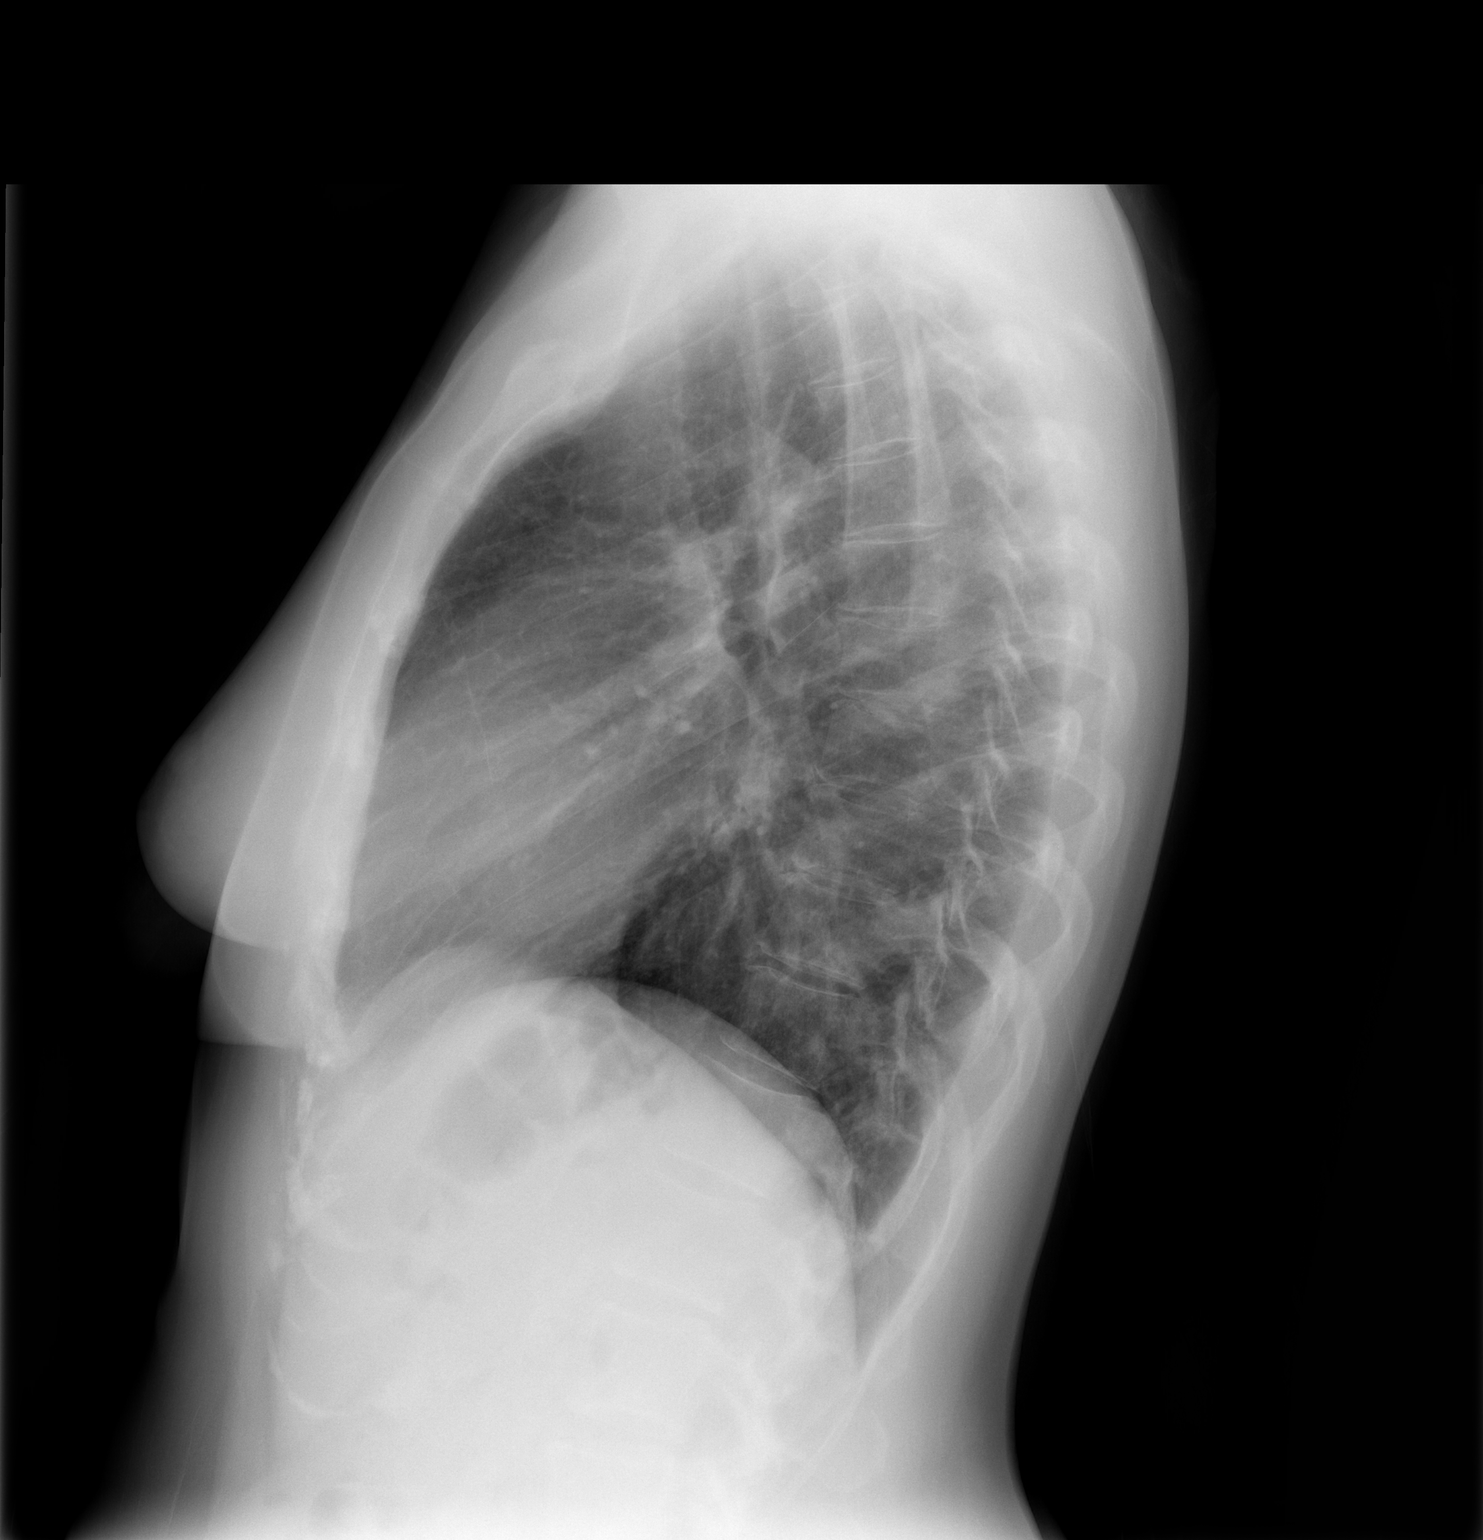

[2 of 2 positions shown; findings below may reference images not displayed]

FINDINGS: Normal heart size and mediastinal contours. No acute infiltrate or
edema. No effusion or pneumothorax. No acute osseous findings.
IMPRESSION: No active cardiopulmonary disease.
